# Patient Record
Sex: Male | Born: 1985 | Hispanic: Yes | Marital: Single | Smoking: Never smoker
Health system: Southern US, Community
[De-identification: ages and names within clinical notes are randomized; demographics above are authoritative.]

## PROBLEM LIST (undated history)

## (undated) DIAGNOSIS — E785 Hyperlipidemia, unspecified: Secondary | ICD-10-CM

## (undated) HISTORY — DX: Hyperlipidemia, unspecified: E78.5

---

## 2015-07-25 ENCOUNTER — Encounter: Payer: Self-pay | Admitting: Family Medicine

## 2015-07-25 ENCOUNTER — Ambulatory Visit (INDEPENDENT_AMBULATORY_CARE_PROVIDER_SITE_OTHER): Payer: Managed Care, Other (non HMO) | Admitting: Family Medicine

## 2015-07-25 VITALS — BP 117/77 | HR 66 | Ht 68.0 in | Wt 144.0 lb

## 2015-07-25 DIAGNOSIS — G43709 Chronic migraine without aura, not intractable, without status migrainosus: Secondary | ICD-10-CM | POA: Diagnosis not present

## 2015-07-25 DIAGNOSIS — M542 Cervicalgia: Secondary | ICD-10-CM | POA: Insufficient documentation

## 2015-07-25 DIAGNOSIS — G43909 Migraine, unspecified, not intractable, without status migrainosus: Secondary | ICD-10-CM | POA: Insufficient documentation

## 2015-07-25 MED ORDER — CYCLOBENZAPRINE HCL 10 MG PO TABS: 10.0000 mg | ORAL_TABLET | Freq: Three times a day (TID) | ORAL | Status: DC | PRN

## 2015-07-25 MED ORDER — SUMATRIPTAN SUCCINATE 50 MG PO TABS: 50.0000 mg | ORAL_TABLET | ORAL | Status: DC | PRN

## 2015-07-25 MED ORDER — NAPROXEN 500 MG PO TABS: 500.0000 mg | ORAL_TABLET | Freq: Two times a day (BID) | ORAL | Status: DC

## 2015-07-25 NOTE — Assessment & Plan Note (Signed)
Infrequent. Not quite yet a candidate for migraine prophylaxis. Will use Imitrex intermittently as needed.

## 2015-07-25 NOTE — Patient Instructions (Signed)
Thank you for coming in today. Come back or go to the emergency room if you notice new weakness new numbness problems walking or bowel or bladder problems. Use a heating pad.  Return if not better in 2 weeks.  Use the imitrex at the start of a migraine.   Cervical Sprain A cervical sprain is an injury in the neck in which the strong, fibrous tissues (ligaments) that connect your neck bones stretch or tear. Cervical sprains can range from mild to severe. Severe cervical sprains can cause the neck vertebrae to be unstable. This can lead to damage of the spinal cord and can result in serious nervous system problems. The amount of time it takes for a cervical sprain to get better depends on the cause and extent of the injury. Most cervical sprains heal in 1 to 3 weeks. CAUSES  Severe cervical sprains may be caused by:   Contact sport injuries (such as from football, rugby, wrestling, hockey, auto racing, gymnastics, diving, martial arts, or boxing).   Motor vehicle collisions.   Whiplash injuries. This is an injury from a sudden forward and backward whipping movement of the head and neck.  Falls.  Mild cervical sprains may be caused by:   Being in an awkward position, such as while cradling a telephone between your ear and shoulder.   Sitting in a chair that does not offer proper support.   Working at a poorly Marketing executive station.   Looking up or down for long periods of time.  SYMPTOMS   Pain, soreness, stiffness, or a burning sensation in the front, back, or sides of the neck. This discomfort may develop immediately after the injury or slowly, 24 hours or more after the injury.   Pain or tenderness directly in the middle of the back of the neck.   Shoulder or upper back pain.   Limited ability to move the neck.   Headache.   Dizziness.   Weakness, numbness, or tingling in the hands or arms.   Muscle spasms.   Difficulty swallowing or chewing.    Tenderness and swelling of the neck.  DIAGNOSIS  Most of the time your health care provider can diagnose a cervical sprain by taking your history and doing a physical exam. Your health care provider will ask about previous neck injuries and any known neck problems, such as arthritis in the neck. X-rays may be taken to find out if there are any other problems, such as with the bones of the neck. Other tests, such as a CT scan or MRI, may also be needed.  TREATMENT  Treatment depends on the severity of the cervical sprain. Mild sprains can be treated with rest, keeping the neck in place (immobilization), and pain medicines. Severe cervical sprains are immediately immobilized. Further treatment is done to help with pain, muscle spasms, and other symptoms and may include:  Medicines, such as pain relievers, numbing medicines, or muscle relaxants.   Physical therapy. This may involve stretching exercises, strengthening exercises, and posture training. Exercises and improved posture can help stabilize the neck, strengthen muscles, and help stop symptoms from returning.  HOME CARE INSTRUCTIONS   Put ice on the injured area.   Put ice in a plastic bag.   Place a towel between your skin and the bag.   Leave the ice on for 15-20 minutes, 3-4 times a day.   If your injury was severe, you may have been given a cervical collar to wear. A cervical collar is  a two-piece collar designed to keep your neck from moving while it heals.  Do not remove the collar unless instructed by your health care provider.  If you have long hair, keep it outside of the collar.  Ask your health care provider before making any adjustments to your collar. Minor adjustments may be required over time to improve comfort and reduce pressure on your chin or on the back of your head.  Ifyou are allowed to remove the collar for cleaning or bathing, follow your health care provider's instructions on how to do so  safely.  Keep your collar clean by wiping it with mild soap and water and drying it completely. If the collar you have been given includes removable pads, remove them every 1-2 days and hand wash them with soap and water. Allow them to air dry. They should be completely dry before you wear them in the collar.  If you are allowed to remove the collar for cleaning and bathing, wash and dry the skin of your neck. Check your skin for irritation or sores. If you see any, tell your health care provider.  Do not drive while wearing the collar.   Only take over-the-counter or prescription medicines for pain, discomfort, or fever as directed by your health care provider.   Keep all follow-up appointments as directed by your health care provider.   Keep all physical therapy appointments as directed by your health care provider.   Make any needed adjustments to your workstation to promote good posture.   Avoid positions and activities that make your symptoms worse.   Warm up and stretch before being active to help prevent problems.  SEEK MEDICAL CARE IF:   Your pain is not controlled with medicine.   You are unable to decrease your pain medicine over time as planned.   Your activity level is not improving as expected.  SEEK IMMEDIATE MEDICAL CARE IF:   You develop any bleeding.  You develop stomach upset.  You have signs of an allergic reaction to your medicine.   Your symptoms get worse.   You develop new, unexplained symptoms.   You have numbness, tingling, weakness, or paralysis in any part of your body.  MAKE SURE YOU:   Understand these instructions.  Will watch your condition.  Will get help right away if you are not doing well or get worse. Document Released: 10/05/2007 Document Revised: 12/13/2013 Document Reviewed: 06/15/2013 [facility] Patient Information 2015 [place], [place]. This information is not intended to replace advice given to you by your health care  provider. Make sure you discuss any questions you have with your health care provider.

## 2015-07-25 NOTE — Progress Notes (Signed)
Casey Bowers is a 29 y.o. male who presents to [facility]  today for establish care and discuss headache and neck pain.  1) migraines: Patient has a history of migraine headaches. He typically has a migraine once every 1-2 months. He had one recently that was quite bad. He no longer has a migraine. He denies any current vomiting fevers chills or diarrhea.  2) neck pain: Patient has pain at the left side of his neck at the insertion onto the skull. This is been present for a few weeks. This happened after migraine headache. He denies any radiating pain weakness or numbness. He's tried some over-the-counter medicines which helps some.   Past Medical History  Diagnosis Date  . Hyperlipidemia    History reviewed. No pertinent past surgical history. History  Substance Use Topics  . Smoking status: Never Smoker   . Smokeless tobacco: Not on file  . Alcohol Use: 0.0 oz/week    0 Standard drinks or equivalent per week   ROS as above: No weight loss sweating leg swelling chest pains palpitations shortness of breath. Medications: Current Outpatient Prescriptions  Medication Sig Dispense Refill  . cyclobenzaprine (FLEXERIL) 10 MG tablet Take 1 tablet (10 mg total) by mouth 3 (three) times daily as needed for muscle spasms. 30 tablet 1  . naproxen (NAPROSYN) 500 MG tablet Take 1 tablet (500 mg total) by mouth 2 (two) times daily with a meal. 30 tablet 1  . SUMAtriptan (IMITREX) 50 MG tablet Take 1 tablet (50 mg total) by mouth every 2 (two) hours as needed for migraine. May repeat in 2 hours if headache persists or recurs. 10 tablet 0   No current facility-administered medications for this visit.   No Known Allergies   Exam:  BP 117/77 mmHg  Pulse 66  Ht  (1.727 m)  Wt 144 lb (65.318 kg)  BMI 21.90 kg/m2 Gen: Well NAD HEENT: EOMI,  MMM Lungs: Normal work of breathing. CTABL Heart: RRR no MRG Abd: NABS, Soft. Nondistended,  Nontender Exts: Brisk capillary refill, warm and well perfused.  C-spine: Nontender to midline. Tender palpation left cervical paraspinal near the insertion onto the school. Normal neck range of motion negative Spurling's test. Upper extremity strength is equal and normal throughout. Reflexes are intact and equal bilateral upper extremities. Grip and pulses and capillary refill are intact bilateral upper extremities.  No results found for this or any previous visit (from the past 24 hour(s)). No results found.   Please see individual assessment and plan sections.

## 2015-07-25 NOTE — Assessment & Plan Note (Signed)
Cervical spasms of the left cervical paraspinal. Treat with naproxen Flexeril heating pad home exercise program. Additionally refer to physical therapy. Return if not better.

## 2015-08-02 ENCOUNTER — Ambulatory Visit (INDEPENDENT_AMBULATORY_CARE_PROVIDER_SITE_OTHER): Payer: Managed Care, Other (non HMO) | Admitting: Physical Therapy

## 2015-08-02 ENCOUNTER — Encounter: Payer: Self-pay | Admitting: Physical Therapy

## 2015-08-02 DIAGNOSIS — M542 Cervicalgia: Secondary | ICD-10-CM | POA: Diagnosis not present

## 2015-08-02 DIAGNOSIS — M436 Torticollis: Secondary | ICD-10-CM

## 2015-08-02 NOTE — Therapy (Signed)
[facility] [place] [address] [place], [place], [address] Phone: [phone]   Fax:  [phone]  Physical Therapy Evaluation  Patient Details  Name: Casey Bowers MRN: [id number] Date of Birth: [birth year] Referring Provider:  Rodolph Bong, MD  Encounter Date: 08/02/2015      PT End of Session - 08/02/15 1506    Visit Number 1   Number of Visits 7   Date for PT Re-Evaluation 08/30/15   PT Start Time 1407   PT Stop Time 1511   PT Time Calculation (min) 64 min   Activity Tolerance Patient tolerated treatment well      Past Medical History  Diagnosis Date  . Hyperlipidemia     History reviewed. No pertinent past surgical history.  There were no vitals filed for this visit.  Visit Diagnosis:  Stiffness of neck - Plan: PT plan of care cert/re-cert  Pain, neck - Plan: PT plan of care cert/re-cert      Subjective Assessment - 08/02/15 1412    Subjective Pt reports he developed neck pain and HA about 4 wks ago when he had a really bad migraine.  About 2-3 days later he had limited cervical motion and sharp pinch in his neck only on the left side. The medicine has been helping his pain, flexoril at night   Pertinent History h/o migraines. He tried Annice Pih before going to the MD   How long can you sit comfortably? intially he wasn't able to move his neck when sitting, it is better now.    Diagnostic tests none   Patient Stated Goals find out what to do so the pain doesn't come back.    Currently in Pain? No/denies  hasn't had pain since his last MD appt. however he has a feeling in the base of his head Lt side that may return            [facility] PT Assessment - 08/02/15 0001    Assessment   Medical Diagnosis neck pain    Onset Date/Surgical Date 07/02/15   Hand Dominance Right   Next MD Visit not scheduled   Prior Therapy none   Precautions   Precautions None   Balance Screen   Has the patient fallen in  the past 6 months No   Has the patient had a decrease in activity level because of a fear of falling?  No   Is the patient reluctant to leave their home because of a fear of falling?  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    AROM Assessment Site Cervical   Cervical Flexion WNL   Cervical Extension WNL   Cervical - Right Side Bend 36   Cervical - Left Side Bend 32  tightness   Cervical - Right Rotation 73   Cervical - Left Rotation 56  tightness   Strength   Overall Strength  Comments shoulders WNL and equal bilat    Flexibility   Soft Tissue Assessment /Muscle Length --  tight pecs bilat   Palpation   Spinal mobility hypomobile through thoracic spine, C3 rotated Lt, pain with UPA mobs here on the Lt side.    Palpation comment palpable trigger point in Lt cerivcal paraspinal C2-4                   [facility] Adult PT Treatment/Exercise - 08/02/15 0001    Exercises   Exercises Neck   Neck Exercises: Theraband   Shoulder External Rotation --  3x10 with yellow band, bilat, elbows by side   Neck Exercises: Seated   Neck Retraction 10 reps   Other Seated Exercise scapular retraction x 10   Other Seated Exercise doorway stretch    Modalities   Modalities Electrical Stimulation;Moist Heat   Moist Heat Therapy   Number Minutes Moist Heat 15 Minutes   Moist Heat Location  Cervical   Electrical Stimulation   Electrical Stimulation Location cervical    Electrical Stimulation Action IFC   Electrical Stimulation Parameters to tolerance   Electrical Stimulation Goals Pain                PT Education - 08/02/15 1504    Education provided Yes   Education Details HEP   Person(s) Educated Patient   Methods Explanation;Demonstration;Handout  handout in spanish   Comprehension Returned demonstration             PT Long Term Goals - 08/02/15 1509    PT LONG TERM GOAL #1   Title I with advanced HEP ( 08/30/15)    Time 4   Period Weeks   Status New   PT LONG TERM GOAL #2   Title increase cervical rotation Lt =/> 60 degrees ( 08/30/15)    Time 4   Period Weeks   Status New   PT LONG TERM GOAL #3   Title report =/> 75% reduction in feelings of stiffness in his neck ( 08/30/15)    Time 4   Period Weeks   Status New   PT LONG TERM GOAL #4   Title improve FOTO =/< 13% impaired ( 08/30/15)    Time 4   Period Weeks   Status New               Plan - 08/02/15 1506    Clinical Impression Statement 29 yo male presents with h/o neck pain and stiffness. It is significantly improved since starting to use medication.  He presents with limited cervical rotation, trigger point and C3 rotation. He also has significant postural changes that place stress on his neck and upper back.    Pt will benefit from skilled therapeutic intervention in order to improve on the following deficits Postural dysfunction;Hypomobility;Pain;Increased muscle spasms;Decreased range of motion   Rehab Potential Excellent   PT Frequency 2x / week   PT Duration 4 weeks   PT Treatment/Interventions Cryotherapy;Electrical Stimulation;Patient/family education;Passive range of motion;Neuromuscular re-education;Manual techniques;Therapeutic exercise;Traction;Moist Heat   PT Next Visit Plan manual therapy TPR to cervical paraspinals, C - spine mobs if still rotated at C3, cervical stab  and postural exercise.    Consulted and Agree with Plan of Care Patient         Problem List Patient Active Problem List   Diagnosis Date Noted  . Migraine headache 07/25/2015  . Neck pain 07/25/2015    Roderic Scarce PT 08/02/2015, 3:20 PM  [facility] [place] [address] [place], [place], [address] Phone: [phone]   Fax:  [phone]

## 2015-08-02 NOTE — Patient Instructions (Signed)
Axial Extension (Chin Tuck)     Hunda el mentn y alargue la parte posterior del cuello. Mantenga __3-5__ segundos mientras cuenta en voz alta. Repita _10___ veces. Haga __4-5__ sesiones por da.  [email or web address].[email or web address]   Scapular Retraction (Standing)   Con brazos a los lados del cuerpo, [employer]. Repita __10__ veces por rutina. Realice _1___ Carlis Stable por sesin. Realice _4-5___ sesiones por da.    Resisted External Rotation: in Neutral - Bilateral   Sentado o de pie, agarre la banda elstica con ambas manos, codos a los lados doblados a 90, antebrazos hacia adelante. Una los omplatos y rote los antebrazos hacia fuera. Mantenga los codos pegados al cuerpo. Repita _10___ veces por rutina. Realice __3__ Carlis Stable por sesin. Realice __1__ sesiones por da.  Pectoral Stretch   Con las manos detrs del marco de la puerta, inclnese suavemente hacia adelante. Debe sentir un estiramiento en el pecho. Mantenga 20-30____ segundos. Repita _1-2___ veces. Haga __1__ sesiones por da.    Karis Juba [phone]

## 2015-08-06 ENCOUNTER — Ambulatory Visit (INDEPENDENT_AMBULATORY_CARE_PROVIDER_SITE_OTHER): Payer: Managed Care, Other (non HMO) | Admitting: Physical Therapy

## 2015-08-06 DIAGNOSIS — M436 Torticollis: Secondary | ICD-10-CM | POA: Diagnosis not present

## 2015-08-06 DIAGNOSIS — M542 Cervicalgia: Secondary | ICD-10-CM

## 2015-08-06 NOTE — Therapy (Signed)
[facility] [place] [address] [place], [place], [address] Phone: [phone]   Fax:  [phone]  Physical Therapy Treatment  Patient Details  Name: Casey Bowers MRN: [id number] Date of Birth: Jul 04, 1986 Referring Provider:  Rodolph Bong, MD  Encounter Date: 08/06/2015      PT End of Session - 08/06/15 1409    Visit Number 2   Number of Visits 7   Date for PT Re-Evaluation 08/30/15   PT Start Time 1405   PT Stop Time 1509   PT Time Calculation (min) 64 min   Activity Tolerance Patient tolerated treatment well      Past Medical History  Diagnosis Date  . Hyperlipidemia     No past surgical history on file.  There were no vitals filed for this visit.  Visit Diagnosis:  Stiffness of neck  Pain, neck      Subjective Assessment - 08/06/15 1409    Subjective Pt reports he hasn't had any headaches since last visit. Neck and shoulders feel sore and tight from performing HEP; 5x/day.    Currently in Pain? No/denies            [facility] PT Assessment - 08/06/15 0001    Assessment   Medical Diagnosis neck pain    Onset Date/Surgical Date 07/02/15   Hand Dominance Right   Next MD Visit not scheduled   Prior Therapy none   AROM   AROM Assessment Site Cervical   Cervical - Right Side Bend 36   Cervical - Left Side Bend 35   Cervical - Right Rotation 75   Cervical - Left Rotation 75                     [facility] Adult PT Treatment/Exercise - 08/06/15 0001    Neck Exercises: Machines for Strengthening   UBE (Upper Arm Bike) L2: 2 min forward/2 min backward   Neck Exercises: Standing   Other Standing Exercises scapula retraction 5 sec hold x 5 reps, tactile cues for proper alignment of head   Neck Exercises: Seated   Neck Retraction 5 reps;10 secs  tactile cues for form   Other Seated Exercise bilateral shoulder ER with red band x 10 x 2 sets   Neck Exercises: Supine   Neck Retraction 10  reps   Upper Extremity D2 Flexion;10 reps  Sash, each arm    Theraband Level (UE D2) Level 2 (Red)   Other Supine Exercise Horizontal abduction with red band (bilat) x 10 x 2 sets    Manual Therapy   Manual Therapy Joint mobilization;Soft tissue mobilization;Passive ROM  performed by supervising PT, Celyn Holt    Manual therapy comments Pt continues with tightness at Lt C3 transverse process, scalenes    Joint Mobilization lateral C3 grade III   Soft tissue mobilization over pressure into axial extension (tender/ increased pain); manual traction; occipital inhibition    Passive ROM end range cervical flexion with slight rotation to the left x 2 reps    Neck Exercises: Stretches   Upper Trapezius Stretch 2 reps;30 seconds  with towel anchoring shoulder   Levator Stretch 1 rep;20 seconds   Other Neck Stretches Doorway stretch mid level x 30 sec x 2 reps, low level x 30 sec x 2 reps              PT Education - 08/06/15 1502    Education provided Yes   Education Details HEP   Person(s)  Educated Patient   Methods Explanation;Demonstration;Handout   Comprehension Verbalized understanding;Returned demonstration             PT Long Term Goals - 08/02/15 1509    PT LONG TERM GOAL #1   Title I with advanced HEP ( 08/30/15)    Time 4   Period Weeks   Status New   PT LONG TERM GOAL #2   Title increase cervical rotation Lt =/> 60 degrees ( 08/30/15)    Time 4   Period Weeks   Status New   PT LONG TERM GOAL #3   Title report =/> 75% reduction in feelings of stiffness in his neck ( 08/30/15)    Time 4   Period Weeks   Status New   PT LONG TERM GOAL #4   Title improve FOTO =/< 13% impaired ( 08/30/15)    Time 4   Period Weeks   Status New               Plan - 08/06/15 1436    Clinical Impression Statement Pt tolerated new exercises well without increase in pain.  Pt required frequent tactile cues to correct form and decrease strain on neck. Pt demo increased neck ROM  this visit. Pt did have slight increase in pain with manual therapy; eliminated with use of MHP and estim at end of session.  Pt making progress towards goals.    Pt will benefit from skilled therapeutic intervention in order to improve on the following deficits Postural dysfunction;Hypomobility;Pain;Increased muscle spasms;Decreased range of motion   Rehab Potential Excellent   PT Frequency 2x / week   PT Duration 4 weeks   PT Treatment/Interventions Cryotherapy;Electrical Stimulation;Patient/family education;Passive range of motion;Neuromuscular re-education;Manual techniques;Therapeutic exercise;Traction;Moist Heat   PT Next Visit Plan manual therapy TPR to cervical paraspinals, C - spine mobs if still rotated at C3, cervical stab and postural exercise.    Consulted and Agree with Plan of Care Patient        Problem List Patient Active Problem List   Diagnosis Date Noted  . Migraine headache 07/25/2015  . Neck pain 07/25/2015    Mayer Camel, PTA 08/06/2015 3:21 PM  [facility] [place] [address] [place], [place], [address] Phone: [phone]   Fax:  [phone]   Manual therapy and cervical mobs in supine as noted above by PT Celyn P. Leonor Liv, PT, MPH 08/06/15 5:01pm

## 2015-08-06 NOTE — Patient Instructions (Signed)
Side Pull: Double Arm   On back, knees bent, feet flat. Arms perpendicular to body, shoulder level, elbows straight but relaxed. Pull arms out to sides, elbows straight. Resistance band comes across collarbones, hands toward floor. Hold momentarily. Slowly return to starting position. Repeat _10__ times, 2 sets. Band color _red____   [name]   On back, knees bent, feet flat, left hand on left hip, right hand above left. Pull right arm DIAGONALLY (hip to shoulder) across chest. Bring right arm along head toward floor. Hold momentarily. Slowly return to starting position. Repeat _10__ times. Do with left arm. Band color _red_____   Shoulder Rotation: Double Arm   On back, knees bent, feet flat, elbows tucked at sides, bent 90, hands palms up. Pull hands apart and down toward floor, keeping elbows near sides. Hold momentarily. Slowly return to starting position. Repeat _10__ times, 2 sets. Band color __red____   Side Bend, Sitting   Sit, head in comfortable, centered position, chin slightly tucked. Gently tilt head, bringing ear toward same-side shoulder. Can put towel over shoulder. Hold _30__ seconds.  Repeat _2__ times per session. Do _2__ sessions per day.   [facility] [address] [place], [place] [address]  [phone] (office) [phone] (fax)

## 2015-08-09 ENCOUNTER — Ambulatory Visit (INDEPENDENT_AMBULATORY_CARE_PROVIDER_SITE_OTHER): Payer: Managed Care, Other (non HMO) | Admitting: Rehabilitative and Restorative Service Providers"

## 2015-08-09 ENCOUNTER — Encounter: Payer: Self-pay | Admitting: Rehabilitative and Restorative Service Providers"

## 2015-08-09 DIAGNOSIS — M436 Torticollis: Secondary | ICD-10-CM

## 2015-08-09 DIAGNOSIS — M542 Cervicalgia: Secondary | ICD-10-CM | POA: Diagnosis not present

## 2015-08-09 NOTE — Patient Instructions (Signed)
Scapula Adduction With Pectorals, Low   Stand in doorframe with palms against frame and arms at 45. Lean forward and squeeze shoulder blades. Hold __30_ seconds. Repeat __3_ times per session. Do _2-3__ sessions per day.  Copyright  VHI. All rights reserved.    Scapula Adduction With Pectorals, Mid-Range   Stand in doorframe with palms against frame and arms at 90. Lean forward and squeeze shoulder blades. Hold _30_ seconds. Repeat __3_ times per session. Do _2-3__ sessions per day.   \Scapula Adduction With Pectorals, High   Stand in doorframe with palms against frame and arms at 120. Lean forward and squeeze shoulder blades. Hold _30__ seconds. Repeat _3__ times per session. Do _2-3__ sessions per day.   Resisted External Rotation: in Neutral - Bilateral   PALMS UP Sit or stand, tubing in both hands, elbows at sides, bent to 90, forearms forward. Pinch shoulder blades together and rotate forearms out. Keep elbows at sides. Repeat __10__ times per set. Do _2-3___ sets per session. Do _2-3___ sessions per day.   Low Row: Standing   Face anchor, feet shoulder width apart. Palms up, pull arms back, squeezing shoulder blades together. Repeat 10__ times per set. Do 2-3__ sets per session. Do 2-3__ sessions per week. Anchor Height: Waist     Strengthening: Resisted Extension   Hold tubing in right hand, arm forward. Pull arm back, elbow straight. Repeat _10___ times per set. Do 2-3____ sets per session. Do 2-3____ sessions per day.

## 2015-08-09 NOTE — Therapy (Signed)
[facility] [address] [place], [place], [address] Phone: [phone]   Fax:  [phone]  Physical Therapy Treatment  Patient Details  Name: Casey Bowers MRN: [id number] Date of Birth: 1986-10-04 Referring Provider:  Rodolph Bong, MD  Encounter Date: 08/09/2015      PT End of Session - 08/09/15 1418    Visit Number 3   Number of Visits 7   Date for PT Re-Evaluation 08/30/15   PT Start Time 0206   PT Stop Time 0254   PT Time Calculation (min) 48 min   Activity Tolerance Patient tolerated treatment well      Past Medical History  Diagnosis Date  . Hyperlipidemia     History reviewed. No pertinent past surgical history.  There were no vitals filed for this visit.  Visit Diagnosis:  Stiffness of neck  Pain, neck      Subjective Assessment - 08/09/15 1403    Subjective No headaches; soreness from last treatment and exercises but feels the exercises are going well and he is getting better, especially since last treatment   Currently in Pain? No/denies                         [facility] Adult PT Treatment/Exercise - 08/09/15 0001    Neck Exercises: Machines for Strengthening   UBE (Upper Arm Bike) L2: 2 min forward/2 min backward   Neck Exercises: Theraband   Scapula Retraction 10 reps  yellow - w/ scap squeeze w/ noodle   Shoulder Extension 10 reps;Red  w/ noodle   Rows 10 reps;Red  w/ noodle   Neck Exercises: Standing   Other Standing Exercises scap squeeze with noodle 10 sec hold 10 reps   Neck Exercises: Seated   Neck Retraction 10 reps;10 secs  tactile cues for form   Neck Exercises: Supine   Neck Retraction 10 reps   Modalities   Modalities Electrical Stimulation;Moist Heat   Cryotherapy   Number Minutes Cryotherapy 15 Minutes   Cryotherapy Location Cervical   Type of Cryotherapy Ice pack   Electrical Stimulation   Electrical Stimulation Location cervical    Electrical Stimulation Action IFC   Electrical Stimulation Parameters to tolerance   Electrical Stimulation Goals Pain   Manual Therapy   Manual Therapy Joint mobilization;Soft tissue mobilization;Passive ROM  performed by supervising PT, Cheikh Bramble    Manual therapy comments improved tightness at Lt C3 transverse process, scalenes    Joint Mobilization lateral C3 grade III   Soft tissue mobilization over pressure into axial extension; manual traction; occipital inhibition soft tissue work through Lt lateral cervical musculature paraspinals and scaleni   Passive ROM end range cervical flexion in straight plane and with slight rotation to the left x 2 reps in each direction                PT Education - 08/09/15 1417    Education provided Yes   Education Details posture and alignment; stretching; strengthening; HEP   Person(s) Educated Patient   Methods Explanation;Demonstration;Tactile cues;Verbal cues;Handout   Comprehension Verbalized understanding;Returned demonstration;Verbal cues required;Tactile cues required             PT Long Term Goals - 08/09/15 1457    PT LONG TERM GOAL #1   Title I with advanced HEP ( 08/30/15)    Time 4   Period Weeks   Status On-going   PT LONG TERM GOAL #2   Title  increase cervical rotation Lt =/> 60 degrees ( 08/30/15)    Time 4   Period Weeks   Status On-going   PT LONG TERM GOAL #3   Title report =/> 75% reduction in feelings of stiffness in his neck ( 08/30/15)    Time 4   Period Weeks   Status On-going   PT LONG TERM GOAL #4   Title improve FOTO =/< 13% impaired ( 08/30/15)    Time 4   Period Weeks   Status On-going               Plan - 08/09/15 1455    Clinical Impression Statement Good response to manual cervical work; decreased tenderness and tightness to palpation through cervical musculature including C3 area.     Pt will benefit from skilled therapeutic intervention in order to improve on the following deficits  Postural dysfunction;Hypomobility;Pain;Increased muscle spasms;Decreased range of motion   Rehab Potential Excellent   PT Frequency 2x / week   PT Duration 4 weeks   PT Treatment/Interventions Cryotherapy;Electrical Stimulation;Patient/family education;Passive range of motion;Neuromuscular re-education;Manual techniques;Therapeutic exercise;Traction;Moist Heat   PT Next Visit Plan manual therapy TPR to cervical paraspinals, C - spine mobs as indicated, cervical stab and postural exercise.    PT Home Exercise Plan 3 way doorway stretch; posterior shoulder girdle strengthening   Consulted and Agree with Plan of Care Patient        Problem List Patient Active Problem List   Diagnosis Date Noted  . Migraine headache 07/25/2015  . Neck pain 07/25/2015    Deepa Barthel Rober Minion, PT, MPH 08/09/2015, 3:07 PM  [facility] [address] [place], [place], [address] Phone: [phone]   Fax:  [phone]

## 2015-08-13 ENCOUNTER — Ambulatory Visit (INDEPENDENT_AMBULATORY_CARE_PROVIDER_SITE_OTHER): Payer: Managed Care, Other (non HMO) | Admitting: Physical Therapy

## 2015-08-13 DIAGNOSIS — M436 Torticollis: Secondary | ICD-10-CM

## 2015-08-13 NOTE — Therapy (Signed)
[place], [place], [address] Phone: [phone]   Fax:  [phone]  Physical Therapy Treatment  Patient Details  Name: Casey Bowers MRN: [id number] Date of Birth: 10-12-1986 Referring Provider:  Gregor Hams, MD  Encounter Date: 08/13/2015      PT End of Session - 08/13/15 1432    Visit Number 4   Number of Visits 7   Date for PT Re-Evaluation 08/30/15   PT Start Time 1401   PT Stop Time 5053   PT Time Calculation (min) 41 min   Activity Tolerance No increased pain;Patient tolerated treatment well      Past Medical History  Diagnosis Date  . Hyperlipidemia     No past surgical history on file.  There were no vitals filed for this visit.  Visit Diagnosis:  No diagnosis found.      Subjective Assessment - 08/13/15 1405    Subjective Pt reports he thinks he may have overdid over weekend, was sore in Rt upper traps.  No headaches. Pt reports he feels like he is getting better with each visit.    Currently in Pain? No/denies            [facility] PT Assessment - 08/13/15 0001    Assessment   Medical Diagnosis neck pain    Onset Date/Surgical Date 07/02/15   Hand Dominance Right   Next MD Visit not scheduled   Prior Therapy none           [facility] Adult PT Treatment/Exercise - 08/13/15 0001    Neck Exercises: Machines for Strengthening   UBE (Upper Arm Bike) L3: 2 min forward/2 min backward   Neck Exercises: Theraband   Rows 10 reps;Red  2 sets with noodle behind back    Shoulder External Rotation 10 reps;Red  with noodle behind back, 2 sets   Neck Exercises: Standing   Neck Retraction 10 reps;3 secs   Neck Exercises: Prone   Axial Exentsion 10 reps   W Back 5 reps  2 sets, with axial extension   Other Prone Exercise Cat/cow stretch x 25 sec x 4 reps.    Cryotherapy   Number Minutes Cryotherapy 12 Minutes   Cryotherapy Location Cervical   Type of Cryotherapy  Ice pack   Neck Exercises: Stretches   Upper Trapezius Stretch 2 reps;30 seconds  with towel anchoring shoulder   Other Neck Stretches Doorway stretch mid level x 30 sec x 2 reps, low level x 30 sec x 2 reps             PT Long Term Goals - 08/13/15 1417    PT LONG TERM GOAL #1   Title I with advanced HEP ( 08/30/15)    Time 4   Period Weeks   Status On-going   PT LONG TERM GOAL #2   Title increase cervical rotation Lt =/> 60 degrees ( 08/30/15)    Time 4   Period Weeks   Status Achieved   PT LONG TERM GOAL #3   Title report =/> 75% reduction in feelings of stiffness in his neck ( 08/30/15)    Status On-going  pt reports 40% reduction reported on 08/13/15   PT LONG TERM GOAL #4   Title improve FOTO =/< 13% impaired ( 08/30/15)    Time 4   Period Weeks   Status On-going               Plan - 08/13/15  1424    Clinical Impression Statement Pt notes 40% reduction of stiffness in neck since beginning therapy; progressing towards LTG #3.  Last headache was a month ago.  Pt tolerated new exercises without production of symptoms.  Has met LTG #2.    Pt will benefit from skilled therapeutic intervention in order to improve on the following deficits Postural dysfunction;Hypomobility;Pain;Increased muscle spasms;Decreased range of motion   Rehab Potential Excellent   PT Treatment/Interventions Cryotherapy;Electrical Stimulation;Patient/family education;Passive range of motion;Neuromuscular re-education;Manual techniques;Therapeutic exercise;Traction;Moist Heat   PT Next Visit Plan Assess response to new exercises; continue progressive strengthening/stretching to cervical spine/posture, manual and modalities as needed.    Consulted and Agree with Plan of Care Patient        Problem List Patient Active Problem List   Diagnosis Date Noted  . Migraine headache 07/25/2015  . Neck pain 07/25/2015   Kerin Perna, PTA 08/13/2015 2:35 PM  [place], [place], [address] Phone: [phone]   Fax:  [phone]

## 2015-08-16 ENCOUNTER — Ambulatory Visit (INDEPENDENT_AMBULATORY_CARE_PROVIDER_SITE_OTHER): Payer: Managed Care, Other (non HMO) | Admitting: Physical Therapy

## 2015-08-16 DIAGNOSIS — M542 Cervicalgia: Secondary | ICD-10-CM | POA: Diagnosis not present

## 2015-08-16 DIAGNOSIS — M436 Torticollis: Secondary | ICD-10-CM | POA: Diagnosis not present

## 2015-08-16 NOTE — Therapy (Addendum)
[place], [place], [address] Phone: [phone]   Fax:  [phone]  Physical Therapy Treatment  Patient Details  Name: Casey Bowers MRN: [id number] Date of Birth: 04-08-1986 Referring Provider:  Gregor Hams, MD  Encounter Date: 08/16/2015      PT End of Session - 08/16/15 1405    Visit Number 5   Number of Visits 7   Date for PT Re-Evaluation 08/30/15   PT Start Time 1540   PT Stop Time 0867   PT Time Calculation (min) 43 min   Activity Tolerance Patient tolerated treatment well;No increased pain      Past Medical History  Diagnosis Date  . Hyperlipidemia     No past surgical history on file.  There were no vitals filed for this visit.  Visit Diagnosis:  Stiffness of neck  Pain, neck      Subjective Assessment - 08/16/15 1405    Subjective Pt reports that he had a headache 2 days ago, in posterior part of head. Pt felt it was stress/work related.  Resolved with medicine and rest.    Currently in Pain? No/denies            [facility] PT Assessment - 08/16/15 0001    Assessment   Medical Diagnosis neck pain    Onset Date/Surgical Date 07/02/15   Hand Dominance Right   Next MD Visit not scheduled   Prior Therapy none          [facility] Adult PT Treatment/Exercise - 08/16/15 0001    Neck Exercises: Machines for Strengthening   UBE (Upper Arm Bike) L3: 2 min forward/2 min backward   Neck Exercises: Theraband   Shoulder External Rotation 10 reps;Green  with noodle behind back, 2 sets   Neck Exercises: Standing   Neck Retraction 10 reps   Other Standing Exercises Sash D2 flex with red band x 10 reps x 2 sets each side.    Other Standing Exercises Rows BUE with green band x 10 x 2 sets; shoulder extension with green band and focus on scap squeeze x 10 reps    Modalities   Modalities --  declined. Will perform at home if needed.    Neck Exercises: Stretches   Upper  Trapezius Stretch 2 reps;30 seconds  with towel anchoring shoulder   Other Neck Stretches Doorway stretch mid level x 30 sec x 2 reps, low level x 30 sec x 2 reps    Other Neck Stretches cross chest stretch x 30 sec x 2 reps each arm            PT Education - 08/16/15 1444    Education provided Yes   Education Details Pt issued green band to advance current HEP.    Person(s) Educated Patient   Methods Explanation   Comprehension Verbalized understanding;Returned demonstration             PT Long Term Goals - 08/16/15 1407    PT LONG TERM GOAL #1   Title I with advanced HEP ( 08/30/15)    Time 4   Period Weeks   Status Achieved   PT LONG TERM GOAL #2   Title increase cervical rotation Lt =/> 60 degrees ( 08/30/15)    Time 4   Period Weeks   Status Achieved   PT LONG TERM GOAL #3   Title report =/> 75% reduction in feelings of stiffness in his neck ( 08/30/15)  Time 4   Period Weeks   Status Achieved  Pt reports 70-80% reduction in stiffness in neck   PT LONG TERM GOAL #4   Title improve FOTO =/< 13% impaired ( 08/30/15)    Time 4   Period Weeks   Status Not Met  scored 18% limitation            Plan - 08/16/15 1454    Clinical Impression Statement Pt tolerated increased resistance with exercises without production of pain.  Pt notes 70-80% reduction of stiffness, meeting LTG#3.  Pt has partially met his goals but is satisfied with his current level of function.  Pt requests to d/c.    Pt will benefit from skilled therapeutic intervention in order to improve on the following deficits Postural dysfunction;Hypomobility;Pain;Increased muscle spasms;Decreased range of motion   Rehab Potential Excellent   PT Frequency 2x / week   PT Duration 4 weeks   PT Treatment/Interventions Cryotherapy;Electrical Stimulation;Patient/family education;Passive range of motion;Neuromuscular re-education;Manual techniques;Therapeutic exercise;Traction;Moist Heat   PT Next Visit Plan  Spoke to supervising PT; will d/c to HEP.    Consulted and Agree with Plan of Care Patient        Problem List Patient Active Problem List   Diagnosis Date Noted  . Migraine headache 07/25/2015  . Neck pain 07/25/2015    Kerin Perna, PTA 08/16/2015 2:55 PM  [place], [place], [address] Phone: [phone]   Fax:  [phone]     PHYSICAL THERAPY DISCHARGE SUMMARY  Visits from Start of Care: 5  Current functional level related to goals / functional outcomes: See above   Remaining deficits: FOTO   Education / Equipment: HEP Plan: Patient agrees to discharge.  Patient goals were partially met. Patient is being discharged due to being pleased with the current functional level.  ?????       Jeral Pinch, PT 08/16/2015 3:15 PM

## 2015-08-20 ENCOUNTER — Encounter: Payer: Managed Care, Other (non HMO) | Admitting: Physical Therapy

## 2016-02-12 ENCOUNTER — Encounter: Payer: Self-pay | Admitting: Family Medicine

## 2016-02-12 ENCOUNTER — Ambulatory Visit (INDEPENDENT_AMBULATORY_CARE_PROVIDER_SITE_OTHER): Payer: Managed Care, Other (non HMO) | Admitting: Family Medicine

## 2016-02-12 VITALS — BP 120/63 | HR 63 | Wt 144.0 lb

## 2016-02-12 DIAGNOSIS — Z114 Encounter for screening for human immunodeficiency virus [HIV]: Secondary | ICD-10-CM

## 2016-02-12 DIAGNOSIS — Z23 Encounter for immunization: Secondary | ICD-10-CM

## 2016-02-12 DIAGNOSIS — IMO0001 Reserved for inherently not codable concepts without codable children: Secondary | ICD-10-CM | POA: Insufficient documentation

## 2016-02-12 DIAGNOSIS — Z0000 Encounter for general adult medical examination without abnormal findings: Secondary | ICD-10-CM | POA: Diagnosis not present

## 2016-02-12 LAB — LIPID PANEL
Cholesterol: 172 mg/dL (ref 125–200)
HDL: 32 mg/dL — ABNORMAL LOW (ref >=40)
LDL CALC: 101 mg/dL (ref <130)
Total CHOL/HDL Ratio: 5.4 Ratio — ABNORMAL HIGH (ref <=5.0)
Triglycerides: 196 mg/dL — ABNORMAL HIGH (ref <150)
VLDL: 39 mg/dL — AB (ref <30)

## 2016-02-12 LAB — CBC
HCT: 44.4 % (ref 39.0–52.0)
HEMOGLOBIN: 15.4 g/dL (ref 13.0–17.0)
MCH: 31.5 pg (ref 26.0–34.0)
MCHC: 34.7 g/dL (ref 30.0–36.0)
MCV: 90.8 fL (ref 78.0–100.0)
MPV: 10.5 fL (ref 8.6–12.4)
PLATELETS: 240 10*3/uL (ref 150–400)
RBC: 4.89 MIL/uL (ref 4.22–5.81)
RDW: 13 % (ref 11.5–15.5)
WBC: 5.6 10*3/uL (ref 4.0–10.5)

## 2016-02-12 LAB — COMPREHENSIVE METABOLIC PANEL
ALBUMIN: 4.4 g/dL (ref 3.6–5.1)
ALT: 19 U/L (ref 9–46)
AST: 17 U/L (ref 10–40)
Alkaline Phosphatase: 84 U/L (ref 40–115)
BILIRUBIN TOTAL: 0.5 mg/dL (ref 0.2–1.2)
BUN: 12 mg/dL (ref 7–25)
CHLORIDE: 103 mmol/L (ref 98–110)
CO2: 25 mmol/L (ref 20–31)
CREATININE: 0.79 mg/dL (ref 0.60–1.35)
Calcium: 9.6 mg/dL (ref 8.6–10.3)
GLUCOSE: 98 mg/dL (ref 65–99)
Potassium: 3.7 mmol/L (ref 3.5–5.3)
SODIUM: 140 mmol/L (ref 135–146)
Total Protein: 6.9 g/dL (ref 6.1–8.1)

## 2016-02-12 NOTE — Patient Instructions (Signed)
Thank you for coming in today. Get labs today.  Return in 6-12 months.  Use Imitrex as needed for migraines.

## 2016-02-12 NOTE — Progress Notes (Signed)
       Casey Bowers is a 30 y.o. male who presents to [facility]: Primary Care today for wellness visit and work biometric screening. Patient presents to clinic today for wellness visit. He has no complaints and no medical problems. He takes Imitrex intermittently for migraines. Has been multiple months since his last migraine. He does not smoke or use smokeless tobacco. He drinks only mildly and intermittently. He has had influenza vaccination at work already this season. He feels well.   Past Medical History  Diagnosis Date  . Hyperlipidemia    History reviewed. No pertinent past surgical history. Social History  Substance Use Topics  . Smoking status: Never Smoker   . Smokeless tobacco: Never Used  . Alcohol Use: 0.0 oz/week    0 Standard drinks or equivalent per week   family history is not on file.  ROS as above Medications: Current Outpatient Prescriptions  Medication Sig Dispense Refill  . SUMAtriptan (IMITREX) 50 MG tablet Take 1 tablet (50 mg total) by mouth every 2 (two) hours as needed for migraine. May repeat in 2 hours if headache persists or recurs. 10 tablet 0   No current facility-administered medications for this visit.   No Known Allergies   Exam:  BP 120/63 mmHg  Pulse 63  Wt 144 lb (65.318 kg) Gen: Well NAD HEENT: EOMI,  MMM Lungs: Normal work of breathing. CTABL Heart: RRR no MRG Abd: NABS, Soft. Nondistended, Nontender Exts: Brisk capillary refill, warm and well perfused.   No results found for this or any previous visit (from the past 24 hour(s)). No results found.   Please see individual assessment and plan sections.

## 2016-02-12 NOTE — Assessment & Plan Note (Signed)
Doing well. Obtain fasting blood work. Recheck in one year or sooner if needed. Tdap given prior to discharge.

## 2016-02-13 LAB — VITAMIN D 25 HYDROXY (VIT D DEFICIENCY, FRACTURES): VIT D 25 HYDROXY: 22 ng/mL — AB (ref 30–100)

## 2016-02-13 LAB — HIV ANTIBODY (ROUTINE TESTING W REFLEX): HIV: NONREACTIVE

## 2016-02-14 NOTE — Progress Notes (Signed)
Quick Note:  Vitamin D deficiency noted. Take 2000 units of vitamin D daily over-the-counter.   ______ 

## 2016-03-17 ENCOUNTER — Encounter: Payer: Self-pay | Admitting: *Deleted

## 2016-03-17 ENCOUNTER — Emergency Department (INDEPENDENT_AMBULATORY_CARE_PROVIDER_SITE_OTHER)
Admission: EM | Admit: 2016-03-17 | Discharge: 2016-03-17 | Disposition: A | Discharge status: Home / Self Care | Payer: Managed Care, Other (non HMO) | Source: Home / Self Care | Attending: Family Medicine | Admitting: Family Medicine

## 2016-03-17 DIAGNOSIS — R110 Nausea: Secondary | ICD-10-CM

## 2016-03-17 DIAGNOSIS — R197 Diarrhea, unspecified: Secondary | ICD-10-CM | POA: Diagnosis not present

## 2016-03-17 MED ORDER — ONDANSETRON 4 MG PO TBDP: ORAL_TABLET | ORAL | Status: DC

## 2016-03-17 MED ORDER — ONDANSETRON 4 MG PO TBDP: 4.0000 mg | ORAL_TABLET | Freq: Once | ORAL | Status: DC

## 2016-03-17 NOTE — ED Provider Notes (Signed)
CSN: [id number]     Arrival date & time 03/17/16  1624 History   First MD Initiated Contact with Patient 03/17/16 1654     Chief Complaint  Patient presents with  . Emesis      Patient is a 30 y.o. male presenting with diarrhea. The history is provided by the patient.  Diarrhea Quality:  Watery Severity:  Moderate Onset quality:  Sudden Duration:  1 day Timing:  Intermittent Progression:  Unchanged Relieved by:  Nothing Exacerbated by: eating. Ineffective treatments: Pepto Bismol. Associated symptoms: abdominal pain, chills, diaphoresis and vomiting   Associated symptoms: no arthralgias, no recent cough, no fever, no headaches, no myalgias and no URI   Vomiting:    Quality:  Stomach contents   Severity:  Mild   Duration:  1 day   Timing:  Sporadic   Progression:  Resolved Risk factors: no recent antibiotic use, no sick contacts, no suspicious food intake and no travel to endemic areas     Past Medical History  Diagnosis Date  . Hyperlipidemia    History reviewed. No pertinent past surgical history. History reviewed. No pertinent family history. Social History  Substance Use Topics  . Smoking status: Never Smoker   . Smokeless tobacco: Never Used  . Alcohol Use: 0.0 oz/week    0 Standard drinks or equivalent per week    Review of Systems  Constitutional: Positive for chills and diaphoresis. Negative for fever.  Gastrointestinal: Positive for vomiting, abdominal pain and diarrhea.  Musculoskeletal: Negative for myalgias and arthralgias.  Neurological: Negative for headaches.    Allergies  Review of patient's allergies indicates no known allergies.  Home Medications   Prior to Admission medications   Medication Sig Start Date End Date Taking? Authorizing Provider  ondansetron (ZOFRAN ODT) 4 MG disintegrating tablet Take one tab by mouth Q6hr prn nausea 03/17/16   Lattie Haw, MD  SUMAtriptan (IMITREX) 50 MG tablet Take 1 tablet (50 mg total) by mouth every 2  (two) hours as needed for migraine. May repeat in 2 hours if headache persists or recurs. 07/25/15   Rodolph Bong, MD   Meds Ordered and Administered this Visit   Medications  ondansetron (ZOFRAN-ODT) disintegrating tablet 4 mg (not administered)    BP 109/73 mmHg  Pulse 70  Temp(Src) 97.6 F (36.4 C) (Oral)  Resp 16  Wt 145 lb (65.772 kg)  SpO2 100% No data found.   Physical Exam Nursing notes and Vital Signs reviewed. Appearance:  Patient appears stated age, and in no acute distress Eyes:  Pupils are equal, round, and reactive to light and accomodation.  Extraocular movement is intact.  Conjunctivae are not inflamed  Nose:  Normal Pharynx:  Normal; moist mucous membranes  Neck:  Supple.  No adenopathy Lungs:  Clear to auscultation.  Breath sounds are equal.  Moving air well. Heart:  Regular rate and rhythm without murmurs, rubs, or gallops.  Abdomen:  Mild diffuse tenderness without masses or hepatosplenomegaly.  Bowel sounds are present and increased.  No CVA or flank tenderness.  Extremities:  No edema.  Skin:  No rash present.   ED Course  Procedures none    MDM   1. Nausea without vomiting; suspect viral gastroenteritis  2. Diarrhea, unspecified type    Zofran ODT  administered PO.  Given Rx for same Begin clear liquids (Pedialyte while having diarrhea) until improved, then advance to a [employer] (Bananas, Rice, Applesauce, Toast).  Then gradually resume a regular diet when tolerated.  Avoid milk products until well.  When stools become more formed, may take Imodium (loperamide) once or twice daily to decrease stool frequency.  If symptoms become significantly worse during the night or over the weekend, proceed to the local emergency room.     Lattie HawStephen A Beese, MD 03/17/16 1910

## 2016-03-17 NOTE — Discharge Instructions (Signed)
Begin clear liquids (Pedialyte while having diarrhea) until improved, then advance to a BRAT diet (Bananas, Rice, Applesauce, Toast).  Then gradually resume a regular diet when tolerated.  Avoid milk products until well.  When stools become more formed, may take Imodium (loperamide) once or twice daily to decrease stool frequency.  °If symptoms become significantly worse during the night or over the weekend, proceed to the local emergency room.  °

## 2016-03-17 NOTE — ED Notes (Signed)
Pt c/o diarrhea, epigastric pain, nausea and vomiting x this morning. He took Pepto Bismol without relief. Denies fever.

## 2016-03-20 ENCOUNTER — Telehealth: Payer: Self-pay

## 2017-03-04 ENCOUNTER — Ambulatory Visit (INDEPENDENT_AMBULATORY_CARE_PROVIDER_SITE_OTHER): Payer: Managed Care, Other (non HMO) | Admitting: Family Medicine

## 2017-03-04 VITALS — BP 133/70 | HR 67 | Wt 153.0 lb

## 2017-03-04 DIAGNOSIS — Z0000 Encounter for general adult medical examination without abnormal findings: Secondary | ICD-10-CM

## 2017-03-04 DIAGNOSIS — R0789 Other chest pain: Secondary | ICD-10-CM

## 2017-03-04 NOTE — Patient Instructions (Signed)
Thank you for coming in today. Let me know if the pressure continues. If you continue to have symptoms we will do more testing.  If all is well we will recheck in 1 year or sooner if needed.   Call or go to the emergency room if you get worse, have trouble breathing, have chest pains, or palpitations.     Dolor de pecho inespecfico (Nonspecific Chest Pain) El dolor de pecho puede deberse a muchas enfermedades diferentes. Siempre existe una posibilidad de que el dolor est relacionado con algo grave, como un infarto de miocardio o un cogulo sanguneo en los pulmones. Hay muchas enfermedades que no son potencialmente mortales que pueden causar dolor de [place]. Si tiene Engineer, miningdolor de Hotel managerpecho, es muy importante que se controle con el mdico. CAUSAS Las causas del dolor de pecho pueden ser las siguientes:  Acidez estomacal.  Neumona o bronquitis.  Ansiedad o estrs.  Inflamacin de la zona que rodea al corazn (pericarditis) o a los pulmones (pleuritis o pleuresa).  Un cogulo sanguneo en el pulmn.  Colapso de un pulmn (neumotrax), que puede aparecer de Regions Financial Corporationmanera repentina por s solo (neumotrax espontneo) o debido a un traumatismo en el trax.  Culebrilla (virus de la varicela zster).  Infarto de miocardio.  Dao de los Byronhuesos, los msculos y los cartlagos que conforman la pared torcica. Esto puede incluir lo siguiente:  Hematomas seos debido a lesiones.  Distensiones musculares o de los cartlagos por tos frecuente o repetida, o por exceso de trabajo.  Fractura de una o ms costillas.  Dolor de TEFL teachercartlago debido a inflamacin (costocondritis). FACTORES DE RIESGO Los factores de riesgo de tener dolor de pecho pueden incluir lo siguiente:  Actividades que incrementan el riesgo de sufrir traumatismos o lesiones en el trax.  Infecciones o enfermedades respiratorias que causan tos frecuente.  Enfermedades o Eastman Kodakexcesos en las comidas que pueden causar Engineering geologistacidez.  Enfermedades  cardacas o antecedentes familiares de enfermedades cardacas.  Enfermedades o comportamientos de salud que aumentan el riesgo de tener un cogulo sanguneo.  Haber tenido varicela (varicela zster). SIGNOS Y SNTOMAS El dolor de pecho puede provocar las siguientes sensaciones:  Ardor u hormigueo en la superficie o en lo profundo del pecho.  Dolor opresivo, continuo o constrictivo.  Dolor vago o intenso que empeora al Clorox Companymoverse, toser o inhalar profundamente.  Dolor que tambin se siente en la espalda, el cuello, el hombro o el brazo, o dolor que se irradia a cualquiera de estas zonas. El dolor de pecho puede aparecer y Geneticist, moleculardesaparecer, o bien puede ser constante. DIAGNSTICO Gretchen ShortQuizs se necesiten anlisis de laboratorio u otros estudios para Veterinary surgeonencontrar la causa del Engineer, miningdolor. El mdico puede indicarle que se haga una prueba llamada EGC (electrocadiograma) ambulatorio. El Regulatory affairs officerelectrocardiograma registra los patrones de los latidos cardacos en el momento en que se realiza el [place] Leonardestudio. Tambin pueden hacerle otros estudios, por ejemplo:  Ecocardiograma transtorcico (ETT). Durante el ecocardiograma, se usan ondas sonoras para crear una imagen de todas las estructuras cardacas y evaluar cmo circula la sangre por el corazn.  Ecocardiograma transesofgico (ETE).Este es un estudio de diagnstico por imgenes ms avanzado que el obtiene imgenes del interior del cuerpo. Le permite al mdico ver el corazn con mayor detalle.  Monitoreo cardaco. Permite que el mdico controle la frecuencia y el ritmo cardaco en tiempo real.  Monitor Holter. Es un dispositivo porttil que eBayregistra los latidos del corazn y puede ayudar a Education administratordiagnosticar las arritmias cardacas. Le permite al American Expressmdico registrar la actividad cardaca durante varios  das, si es necesario.  Pruebas de esfuerzo. Estas pueden realizarse durante el ejercicio o mediante la administracin de un medicamento que acelera los latidos del corazn.  Anlisis de  [place].  Diagnstico por imgenes. TRATAMIENTO El tratamiento depende de la causa del dolor de [place]. El tratamiento puede incluir lo siguiente:  Medicamentos. Estos pueden incluir lo siguiente:  Inhibidores de Publishing copy.  Antiinflamatorios.  Analgsicos para las enfermedades inflamatorias.  Antibiticos, si hay una infeccin.  Medicamentos para [employer].  Medicamentos para tratar la enfermedad arterial coronaria.  Tratamiento complementario para las enfermedades que no requieren la toma de medicamentos. Esto puede incluir lo siguiente:  Descansar.  Aplicar compresas fras o calientes en las zonas lesionadas.  Limitar las actividades hasta que [employer]. INSTRUCCIONES PARA EL CUIDADO EN EL HOGAR  Si le recetaron antibiticos, asegrese de terminarlos, incluso si comienza a sentirse mejor.  Evite las [employer] causen dolor de [place].  No consuma ningn producto que contenga tabaco, lo que incluye cigarrillos, tabaco de Theatre manager o Administrator, Civil Service. Si necesita ayuda para dejar de fumar, consulte al mdico.  No beba alcohol.  Tome los medicamentos solamente como se lo haya indicado el mdico.  Concurra a todas las visitas de control como se lo haya indicado el mdico. Esto es importante. Esto incluye otros estudios si el dolor de pecho no desaparece.  Si la acidez es la causa del dolor de [place], tal vez le aconsejen que mantenga la cabeza levantada (elevada) mientras duerme. Esto reduce la probabilidad de que el cido retroceda del estmago al esfago.  Haga cambios en su estilo de vida como se lo haya indicado el mdico. Estos pueden incluir lo siguiente:  Education administrator actividad fsica con regularidad. Pida al mdico que le sugiera algunas actividades que sean seguras para usted.  Consumir una dieta cardiosaludable. Un nutricionista matriculado puede ayudarlo a Software engineer saludables.  Mantener un peso  saludable.  Controlar la diabetes, si es necesario.  Reducir las situaciones de estrs. SOLICITE ATENCIN MDICA SI:  El dolor de pecho no desaparece despus del tratamiento.  Tiene una erupcin cutnea con ampollas en el pecho.  Tiene fiebre. SOLICITE ATENCIN MDICA DE INMEDIATO SI:  El dolor en el pecho es ms intenso.  La tos empeora, o expectora sangre.  Siente un dolor abdominal intenso.  Siente debilidad intensa.  Se desmaya.  Tiene escalofros.  Tiene una molestia repentina e inexplicable en el pecho.  Tiene molestias repentinas e [employer], la espalda, el cuello o la [place].  Le falta el aire en cualquier momento.  Comienza a sudar de [place] repentina o la piel se le humedece.  Siente nuseas o vomita.  Se siente repentinamente mareado o se desmaya.  Siente que el corazn comienza a latir rpidamente o que se saltea latidos. Estos sntomas pueden representar un problema grave que constituye Radio broadcast assistant. No espere hasta que los sntomas desaparezcan. Solicite atencin mdica de inmediato. Comunquese con el servicio de emergencias de su localidad (911 en los [place]). No conduzca por sus propios medios [employer]. Esta informacin no tiene Theme park manager el consejo del mdico. Asegrese de hacerle al mdico cualquier pregunta que tenga. Document Released: 12/08/2005 Document Revised: 12/29/2014 Document Reviewed: 06/16/2016 Elsevier Interactive Patient Education  2017 [employer].

## 2017-03-04 NOTE — Progress Notes (Signed)
Casey Bowers is a 31 y.o. male [name] today for well adult visit and discuss chest pressure.   Patient is well with only mild issues. He notes occasional chest pressure. This is occurring off and on for years however recently he notes he had a 10 minute episode of central chest pressure occurring at rest. He denies palpitations radiating pain or exertional pain. He denies shortness of breath and leg swelling or weakness. He feels well otherwise. No fevers or chills.   Past Medical History:  Diagnosis Date  . Hyperlipidemia    No past surgical history on file. Social History  Substance Use Topics  . Smoking status: Never Smoker  . Smokeless tobacco: Never Used  . Alcohol use 0.0 oz/week   family history is not on file.  ROS as above:  Medications: Current Outpatient Prescriptions  Medication Sig Dispense Refill  . ondansetron (ZOFRAN ODT) 4 MG disintegrating tablet Take one tab by mouth Q6hr prn nausea 12 tablet 0  . SUMAtriptan (IMITREX) 50 MG tablet Take 1 tablet (50 mg total) by mouth every 2 (two) hours as needed for migraine. May repeat in 2 hours if headache persists or recurs. 10 tablet 0   No current facility-administered medications for this visit.    No Known Allergies  Health Maintenance Health Maintenance  Topic Date Due  . INFLUENZA VACCINE  07/22/2016  . TETANUS/TDAP  02/11/2026  . HIV Screening  Completed     Exam:  BP 133/70   Pulse 67   Wt 153 lb (69.4 kg)   BMI 23.26 kg/m  Gen: Well NAD HEENT: EOMI,  MMM Lungs: Normal work of breathing. CTABL Heart: RRR no MRG Abd: NABS, Soft. Nondistended, Nontender Exts: Brisk capillary refill, warm and well perfused.   12-lead EKG shows normal sinus rhythm at 60 bpm. No ST segment elevation or depression. Normal EKG.  Lab Results  Component Value Date   CHOL 172 02/12/2016   HDL 32 (L) 02/12/2016   LDLCALC 101 02/12/2016   TRIG 196 (H) 02/12/2016   CHOLHDL 5.4 (H) 02/12/2016     Chemistry      Component Value Date/Time   NA 140 02/12/2016 1026   K 3.7 02/12/2016 1026   CL 103 02/12/2016 1026   CO2 25 02/12/2016 1026   BUN 12 02/12/2016 1026   CREATININE 0.79 02/12/2016 1026      Component Value Date/Time   CALCIUM 9.6 02/12/2016 1026   ALKPHOS 84 02/12/2016 1026   AST 17 02/12/2016 1026   ALT 19 02/12/2016 1026   BILITOT 0.5 02/12/2016 1026       No results found for this or any previous visit (from the past 72 hour(s)). No results found.    Assessment and Plan: 31 y.o. male with  Well adult. Doing well. Plan for basic workup of chest pain. [employer] form filled out. We'll plan on rechecking lipids in a year to area and otherwise if chest x-ray is reassuring and symptoms don't worsen plan for watchful waiting.   Orders Placed This Encounter  Procedures  . DG Chest 2 View    Order Specific Question:   Reason for exam:    Answer:   chest pressure/pain    Order Specific Question:   [facility]?    [facility]  . EKG 12-Lead   No orders of the defined types were placed in this encounter.  Discussed warning signs or symptoms. Please see discharge instructions. Patient expresses understanding.

## 2017-06-12 ENCOUNTER — Ambulatory Visit (INDEPENDENT_AMBULATORY_CARE_PROVIDER_SITE_OTHER): Payer: Managed Care, Other (non HMO) | Admitting: Family Medicine

## 2017-06-12 ENCOUNTER — Encounter: Payer: Self-pay | Admitting: Family Medicine

## 2017-06-12 ENCOUNTER — Ambulatory Visit (INDEPENDENT_AMBULATORY_CARE_PROVIDER_SITE_OTHER): Payer: Managed Care, Other (non HMO)

## 2017-06-12 VITALS — BP 117/70 | HR 71 | Temp 98.3°F | Wt 153.0 lb

## 2017-06-12 DIAGNOSIS — R05 Cough: Secondary | ICD-10-CM

## 2017-06-12 DIAGNOSIS — R059 Cough, unspecified: Secondary | ICD-10-CM

## 2017-06-12 MED ORDER — AZITHROMYCIN 250 MG PO TABS: 250.0000 mg | ORAL_TABLET | Freq: Every day | ORAL | 0 refills | Status: DC

## 2017-06-12 MED ORDER — BENZONATATE 200 MG PO CAPS: 200.0000 mg | ORAL_CAPSULE | Freq: Three times a day (TID) | ORAL | 0 refills | Status: DC | PRN

## 2017-06-12 MED ORDER — GUAIFENESIN-CODEINE 100-10 MG/5ML PO SOLN: 5.0000 mL | Freq: Every evening | ORAL | 0 refills | Status: DC | PRN

## 2017-06-12 MED ORDER — IPRATROPIUM BROMIDE 0.06 % NA SOLN: 2.0000 | NASAL | 6 refills | Status: DC | PRN

## 2017-06-12 NOTE — Progress Notes (Signed)
Casey Bowers is a 31 y.o. male [facility] today for cough. Patient has a two-week history of cough and congestion. The cough is sometimes productive and will sometimes keeping up at night. He denies wheezing fevers chills nausea vomiting diarrhea or shortness of breath. No chest pain or palpitations. He has tried some over-the-counter medications which have helped a little. He denies sick contacts.   Past Medical History:  Diagnosis Date  . Hyperlipidemia    No past surgical history on file. Social History  Substance Use Topics  . Smoking status: Never Smoker  . Smokeless tobacco: Never Used  . Alcohol use 0.0 oz/week   family history is not on file.  ROS as above:  Medications: Current Outpatient Prescriptions  Medication Sig Dispense Refill  . azithromycin (ZITHROMAX) 250 MG tablet Take 1 tablet (250 mg total) by mouth daily. Take first 2 tablets together, then 1 every day until finished. 6 tablet 0  . benzonatate (TESSALON) 200 MG capsule Take 1 capsule (200 mg total) by mouth 3 (three) times daily as needed for cough. 45 capsule 0  . guaiFENesin-codeine 100-10 MG/5ML syrup Take 5 mLs by mouth at bedtime as needed for cough. 120 mL 0  . ipratropium (ATROVENT) 0.06 % nasal spray Place 2 sprays into both nostrils every 4 (four) hours as needed for rhinitis. 10 mL 6  . ondansetron (ZOFRAN ODT) 4 MG disintegrating tablet Take one tab by mouth Q6hr prn nausea 12 tablet 0  . SUMAtriptan (IMITREX) 50 MG tablet Take 1 tablet (50 mg total) by mouth every 2 (two) hours as needed for migraine. May repeat in 2 hours if headache persists or recurs. 10 tablet 0   No current facility-administered medications for this visit.    No Known Allergies  Health Maintenance Health Maintenance  Topic Date Due  . INFLUENZA VACCINE  07/22/2017  .  TETANUS/TDAP  02/11/2026  . HIV Screening  Completed     Exam:  BP 117/70   Pulse 71   Temp 98.3 F (36.8 C) (Oral)   Wt 153 lb (69.4 kg)   SpO2 96%   BMI 23.26 kg/m  Gen: Well NAD HEENT: EOMI,  MMM Posterior pharynx with cobblestoning and nasal discharge. Normal tympanic membranes bilaterally. Lungs: Normal work of breathing. Coarse breath sounds left lower lung Heart: RRR no MRG Abd: NABS, Soft. Nondistended, Nontender Exts: Brisk capillary refill, warm and well perfused.    No results found for this or any previous visit (from the past 72 hour(s)). Dg Chest 2 View  Result Date: 06/12/2017 CLINICAL DATA:  Cough for 2 weeks EXAM: CHEST  2 VIEW COMPARISON:  None. FINDINGS: The heart size and mediastinal contours are within normal limits. Both lungs are clear. The visualized skeletal structures are unremarkable. IMPRESSION: No active cardiopulmonary disease. Electronically Signed   By: Alcide CleverMark  Lukens M.D.   On: 06/12/2017 10:24      Assessment and Plan: 31 y.o. male with viral bronchitis. Treat with conservative measures including over-the-counter medications Atrovent nasal spray Tessalon and codeine cough syrup. Reserve azithromycin for use if not better.   Orders Placed This Encounter  Procedures  . DG Chest 2 View    Order Specific Question:   Reason for exam:    Answer:   Cough, assess intra-thoracic pathology    Order Specific Question:   Preferred imaging location?    Answer:   [facility]   Meds  ordered this encounter  Medications  . ipratropium (ATROVENT) 0.06 % nasal spray    Sig: Place 2 sprays into both nostrils every 4 (four) hours as needed for rhinitis.    Dispense:  10 mL    Refill:  6  . azithromycin (ZITHROMAX) 250 MG tablet    Sig: Take 1 tablet (250 mg total) by mouth daily. Take first 2 tablets together, then 1 every day until finished.    Dispense:  6 tablet    Refill:  0  . guaiFENesin-codeine 100-10 MG/5ML syrup    Sig: Take 5 mLs by  mouth at bedtime as needed for cough.    Dispense:  120 mL    Refill:  0  . benzonatate (TESSALON) 200 MG capsule    Sig: Take 1 capsule (200 mg total) by mouth 3 (three) times daily as needed for cough.    Dispense:  45 capsule    Refill:  0     Discussed warning signs or symptoms. Please see discharge instructions. Patient expresses understanding.  Patient researched [facility] Controlled Substance Reporting System.

## 2017-06-12 NOTE — Patient Instructions (Addendum)
Thank you for coming in today. Get the xray today.  We will contact you with results.  Use the nasal spray.  Take the cough pills during the day and the liquid at night.   Call or go to the emergency room if you get worse, have trouble breathing, have chest pains, or palpitations.    Acute Bronchitis, Adult Acute bronchitis is when air tubes (bronchi) in the lungs suddenly get swollen. The condition can make it hard to breathe. It can also cause these symptoms:  A cough.  Coughing up clear, yellow, or green mucus.  Wheezing.  Chest congestion.  Shortness of breath.  A fever.  Body aches.  Chills.  A sore throat.  Follow these instructions at home: Medicines  Take over-the-counter and prescription medicines only as told by your doctor.  If you were prescribed an antibiotic medicine, take it as told by your doctor. Do not stop taking the antibiotic even if you start to feel better. General instructions  Rest.  Drink enough fluids to keep your pee (urine) clear or pale yellow.  Avoid smoking and secondhand smoke. If you smoke and you need help quitting, ask your doctor. Quitting will help your lungs heal faster.  Use an inhaler, cool mist vaporizer, or humidifier as told by your doctor.  Keep all follow-up visits as told by your doctor. This is important. How is this prevented? To lower your risk of getting this condition again:  Wash your hands often with soap and water. If you cannot use soap and water, use hand sanitizer.  Avoid contact with people who have cold symptoms.  Try not to touch your hands to your mouth, nose, or eyes.  Make sure to get the flu shot every year.  Contact a doctor if:  Your symptoms do not get better in 2 weeks. Get help right away if:  You cough up blood.  You have chest pain.  You have very bad shortness of breath.  You become dehydrated.  You faint (pass out) or keep feeling like you are going to pass out.  You keep  throwing up (vomiting).  You have a very bad headache.  Your fever or chills gets worse. This information is not intended to replace advice given to you by your health care provider. Make sure you discuss any questions you have with your health care provider. Document Released: 05/26/2008 Document Revised: 07/16/2016 Document Reviewed: 05/28/2016 Elsevier Interactive Patient Education  2017 [employer]

## 2018-02-11 ENCOUNTER — Encounter: Payer: Self-pay | Admitting: Family Medicine

## 2018-02-11 ENCOUNTER — Telehealth: Payer: Self-pay | Admitting: Family Medicine

## 2018-02-11 ENCOUNTER — Ambulatory Visit (INDEPENDENT_AMBULATORY_CARE_PROVIDER_SITE_OTHER): Payer: 59

## 2018-02-11 ENCOUNTER — Ambulatory Visit (INDEPENDENT_AMBULATORY_CARE_PROVIDER_SITE_OTHER): Payer: 59 | Admitting: Family Medicine

## 2018-02-11 VITALS — BP 118/74 | HR 83 | Temp 97.9°F | Wt 163.0 lb

## 2018-02-11 DIAGNOSIS — R140 Abdominal distension (gaseous): Secondary | ICD-10-CM

## 2018-02-11 DIAGNOSIS — R1030 Lower abdominal pain, unspecified: Secondary | ICD-10-CM

## 2018-02-11 LAB — POCT URINALYSIS DIPSTICK
Bilirubin, UA: NEGATIVE
Glucose, UA: NEGATIVE
Ketones, UA: NEGATIVE
LEUKOCYTES UA: NEGATIVE
NITRITE UA: NEGATIVE
Spec Grav, UA: 1.025 (ref 1.010–1.025)
Urobilinogen, UA: 0.2 E.U./dL
pH, UA: 5.5 (ref 5.0–8.0)

## 2018-02-11 LAB — CBC WITH DIFFERENTIAL/PLATELET
BASOS ABS: 15 {cells}/uL (ref 0–200)
Basophils Relative: 0.1 %
EOS PCT: 1.1 %
Eosinophils Absolute: 168 cells/uL (ref 15–500)
HEMATOCRIT: 45.4 % (ref 38.5–50.0)
Hemoglobin: 16.1 g/dL (ref 13.2–17.1)
LYMPHS ABS: 1714 {cells}/uL (ref 850–3900)
MCH: 32.2 pg (ref 27.0–33.0)
MCHC: 35.5 g/dL (ref 32.0–36.0)
MCV: 90.8 fL (ref 80.0–100.0)
MONOS PCT: 9.2 %
MPV: 10.3 fL (ref 7.5–12.5)
Neutro Abs: 11995 cells/uL — ABNORMAL HIGH (ref 1500–7800)
Neutrophils Relative %: 78.4 %
PLATELETS: 251 10*3/uL (ref 140–400)
RBC: 5 10*6/uL (ref 4.20–5.80)
RDW: 13.6 % (ref 11.0–15.0)
Total Lymphocyte: 11.2 %
WBC mixed population: 1408 cells/uL — ABNORMAL HIGH (ref 200–950)
WBC: 15.3 10*3/uL — AB (ref 3.8–10.8)

## 2018-02-11 LAB — COMPLETE METABOLIC PANEL WITH GFR
AG Ratio: 1.9 (calc) (ref 1.0–2.5)
ALKALINE PHOSPHATASE (APISO): 100 U/L (ref 40–115)
ALT: 24 U/L (ref 9–46)
AST: 18 U/L (ref 10–40)
Albumin: 4.4 g/dL (ref 3.6–5.1)
BUN: 17 mg/dL (ref 7–25)
CO2: 29 mmol/L (ref 20–32)
CREATININE: 0.8 mg/dL (ref 0.60–1.35)
Calcium: 9.2 mg/dL (ref 8.6–10.3)
Chloride: 101 mmol/L (ref 98–110)
GFR, Est African American: 138 mL/min/{1.73_m2} (ref >=60)
GFR, Est Non African American: 119 mL/min/{1.73_m2} (ref >=60)
GLOBULIN: 2.3 g/dL (ref 1.9–3.7)
Glucose, Bld: 102 mg/dL — ABNORMAL HIGH (ref 65–99)
POTASSIUM: 3.8 mmol/L (ref 3.5–5.3)
SODIUM: 137 mmol/L (ref 135–146)
Total Bilirubin: 0.8 mg/dL (ref 0.2–1.2)
Total Protein: 6.7 g/dL (ref 6.1–8.1)

## 2018-02-11 MED ORDER — CIPROFLOXACIN HCL 500 MG PO TABS: 500.0000 mg | ORAL_TABLET | Freq: Two times a day (BID) | ORAL | 0 refills | Status: DC

## 2018-02-11 MED ORDER — METRONIDAZOLE 500 MG PO TABS: 500.0000 mg | ORAL_TABLET | Freq: Two times a day (BID) | ORAL | 0 refills | Status: DC

## 2018-02-11 MED ORDER — IOPAMIDOL (ISOVUE-300) INJECTION 61%
100.0000 mL | Freq: Once | INTRAVENOUS | Status: AC | PRN
  Administered 2018-02-11: 100 mL via INTRAVENOUS

## 2018-02-11 NOTE — Telephone Encounter (Signed)
CT scan shows diverticulitis which is an infection of the colon.  We should be able to treat this with oral antibiotics.  I sent in ciprofloxacin and metronidazole to the [facility] on [address] [place].  Please start taking both of these twice daily.  Do not drink alcohol while taking metronidazole.  We will give you the lab results when I get them.  Recheck Monday if not better.  Return Friday if worsening.  Go to the emergency room if you get worse.

## 2018-02-11 NOTE — Patient Instructions (Signed)
Thank you for coming in today. Get labs and CT scan now.  I will call you with results tonight.  Make sure you phone is on and it works.  Make sure to answer your phone.    Appendicitis The appendix is a tube that is shaped like a finger. It is connected to the large intestine. Appendicitis means that this tube is swollen (inflamed). Without treatment, the tube can tear (rupture). This can lead to a life-threatening infection. It can also cause you to have sores (abscesses). These sores hurt. What are the causes? This condition may be caused by something that blocks the appendix, such as:  A ball of poop (stool).  Lymph glands that are bigger than normal.  Sometimes, the cause is not known. What are the signs or symptoms? Symptoms of this condition include:  Pain around the belly button (navel). ? The pain moves toward the lower right belly (abdomen). ? The pain can get worse with time. ? The pain can get worse if you cough. ? The pain can get worse if you move suddenly.  Tenderness in the lower right belly.  Feeling sick to your stomach (nauseous).  Throwing up (vomiting).  Not feeling hungry (loss of appetite).  A fever.  Having a hard time pooping (constipation).  Watery poop (diarrhea).  Not feeling well.  How is this treated? Usually, this condition is treated by taking out the appendix (appendectomy). There are two ways that the appendix can be taken out:  Open surgery. In this surgery, the appendix is taken out through a large cut (incision). The cut is made in the lower right belly. This surgery may be picked if: ? You have scars from another surgery. ? You have a bleeding condition. ? You are pregnant and will be having your baby soon. ? You have a condition that does not allow the other type of surgery.  Laparoscopic surgery. In this surgery, the appendix is taken out through small cuts. Often, this surgery: ? Causes less pain. ? Causes fewer  problems. ? Is easier to heal from.  If your appendix tears and a sore forms:  A drain may be put into the sore. The drain will be used to get rid of fluid.  You may get an antibiotic medicine through an IV tube.  Your appendix may or may not need to be taken out.  This information is not intended to replace advice given to you by your health care provider. Make sure you discuss any questions you have with your health care provider. Document Released: 03/01/2012 Document Revised: 05/15/2016 Document Reviewed: 04/25/2015 Elsevier Interactive Patient Education  [employer]2018 Elsevier Inc.

## 2018-02-11 NOTE — Progress Notes (Signed)
Casey Bowers is a 32 y.o. male who presents to [facility]: Primary Care Sports Medicine today for abdominal pain. Casey Bowers notes a 1 day history of central to right lower abdominal pain. The pain started yesterday and is worsening. He notes 1 episode of diarrhea yesterday but no blood in the stool. He denies any vomiting or fever or chills. He denies any urinary symptoms or scrotal bulging. He tried some Ibuprofen and heat which helped a bit. He denies any significant surgical history.    Past Medical History:  Diagnosis Date  . Hyperlipidemia    No past surgical history on file. Social History   Tobacco Use  . Smoking status: Never Smoker  . Smokeless tobacco: Never Used  Substance Use Topics  . Alcohol use: Yes    Alcohol/week: 0.0 oz   family history is not on file.  ROS as above: No headache, visual changes, nausea, vomiting, , constipation, dizziness, , skin rash, fevers, chills, night sweats, weight loss, swollen lymph nodes, body aches, joint swelling, muscle aches, chest pain, shortness of breath, mood changes, visual or auditory hallucinations.   Medications: Current Outpatient Medications  Medication Sig Dispense Refill  . ipratropium (ATROVENT) 0.06 % nasal spray Place 2 sprays into both nostrils every 4 (four) hours as needed for rhinitis. 10 mL 6  . ondansetron (ZOFRAN ODT) 4 MG disintegrating tablet Take one tab by mouth Q6hr prn nausea 12 tablet 0  . SUMAtriptan (IMITREX) 50 MG tablet Take 1 tablet (50 mg total) by mouth every 2 (two) hours as needed for migraine. May repeat in 2 hours if headache persists or recurs. 10 tablet 0   No current facility-administered medications for this visit.    No Known Allergies  Health Maintenance Health Maintenance  Topic Date Due  . INFLUENZA VACCINE  10/14/2018 (Originally 07/22/2017)  . TETANUS/TDAP  02/11/2026  . HIV  Screening  Completed     Exam:  BP 118/74   Pulse 83   Temp 97.9 F (36.6 C) (Oral)   Wt 163 lb (73.9 kg)   BMI 24.78 kg/m  Gen: Well NAD HEENT: EOMI,  MMM Lungs: Normal work of breathing. CTABL Heart: RRR no MRG Abd: NABS, Soft. Nondistended, TTP central to right lower quadrant with rebound and guarding. Positive left Psoas sign.  Exts: Brisk capillary refill, warm and well perfused.  Genitals. No scrotal swelling or mass. No hernia. No penile discharge.   Results for orders placed or performed in visit on 02/11/18 (from the past 72 hour(s))  POCT Urinalysis Dipstick     Status: None   Collection Time: 02/11/18 11:46 AM  Result Value Ref Range   Color, UA yellow    Clarity, UA clear    Glucose, UA negative    Bilirubin, UA negative    Ketones, UA negative    Spec Grav, UA 1.025 1.010 - 1.025   Blood, UA small    pH, UA 5.5 5.0 - 8.0   Protein, UA trace    Urobilinogen, UA 0.2 0.2 or 1.0 E.U./dL   Nitrite, UA negative    Leukocytes, UA Negative Negative   Appearance     Odor     No results found.    Assessment and Plan: 32 y.o. male with severe new abdominal pain with rebound and guarding is concerning for peritonitis due to appendicitis or diverticulitis.  Plan for STAT Ct abd and pelvis as well as STAT CBC and CMP. Urine studies are  pending.  I will contact Casey Bowers this evening with results and treatment plan. Precautions reviewed.   Orders Placed This Encounter  Procedures  . Urine Culture  . CT Abdomen Pelvis W Contrast    Standing Status:   Future    Standing Expiration Date:   05/12/2019    Order Specific Question:   If indicated for the ordered procedure, I authorize the administration of contrast media per Radiology protocol    Answer:   Yes    Order Specific Question:   Preferred imaging [facility]?    [facility]    Order Specific Question:   Call Results- Best Contact Number?    Answer:   Casey Bowers cell [phone]    Order  Specific Question:   Radiology Contrast Protocol - do NOT remove file path    Answer:   [email or web address]  . CBC with Differential/Platelet  . COMPLETE METABOLIC PANEL WITH GFR  . POCT Urinalysis Dipstick   No orders of the defined types were placed in this encounter.    Discussed warning signs or symptoms. Please see discharge instructions. Patient expresses understanding.

## 2018-02-12 LAB — URINE CULTURE
MICRO NUMBER:: 90231641
SPECIMEN QUALITY: ADEQUATE

## 2018-02-12 NOTE — Telephone Encounter (Signed)
Patient has been advised of results and he verbally understands. Cheryle Dark,CMA

## 2018-04-06 ENCOUNTER — Encounter: Payer: Self-pay | Admitting: Family Medicine

## 2018-04-06 ENCOUNTER — Ambulatory Visit (INDEPENDENT_AMBULATORY_CARE_PROVIDER_SITE_OTHER): Payer: 59 | Admitting: Family Medicine

## 2018-04-06 VITALS — HR 71 | Temp 97.3°F | Ht 68.5 in | Wt 158.0 lb

## 2018-04-06 DIAGNOSIS — Z0000 Encounter for general adult medical examination without abnormal findings: Secondary | ICD-10-CM

## 2018-04-06 LAB — COMPLETE METABOLIC PANEL WITH GFR
AG RATIO: 1.9 (calc) (ref 1.0–2.5)
ALT: 24 U/L (ref 9–46)
AST: 19 U/L (ref 10–40)
Albumin: 4.5 g/dL (ref 3.6–5.1)
Alkaline phosphatase (APISO): 82 U/L (ref 40–115)
BILIRUBIN TOTAL: 0.4 mg/dL (ref 0.2–1.2)
BUN: 11 mg/dL (ref 7–25)
CALCIUM: 9.5 mg/dL (ref 8.6–10.3)
CO2: 31 mmol/L (ref 20–32)
Chloride: 106 mmol/L (ref 98–110)
Creat: 0.87 mg/dL (ref 0.60–1.35)
GFR, EST AFRICAN AMERICAN: 133 mL/min/{1.73_m2} (ref >=60)
GFR, EST NON AFRICAN AMERICAN: 115 mL/min/{1.73_m2} (ref >=60)
GLOBULIN: 2.4 g/dL (ref 1.9–3.7)
Glucose, Bld: 109 mg/dL — ABNORMAL HIGH (ref 65–99)
POTASSIUM: 4.1 mmol/L (ref 3.5–5.3)
SODIUM: 142 mmol/L (ref 135–146)
TOTAL PROTEIN: 6.9 g/dL (ref 6.1–8.1)

## 2018-04-06 LAB — LIPID PANEL W/REFLEX DIRECT LDL
Cholesterol: 182 mg/dL (ref <200)
HDL: 38 mg/dL — ABNORMAL LOW (ref >40)
LDL Cholesterol (Calc): 110 mg/dL (calc) — ABNORMAL HIGH
NON-HDL CHOLESTEROL (CALC): 144 mg/dL — AB (ref <130)
Total CHOL/HDL Ratio: 4.8 (calc) (ref <5.0)
Triglycerides: 222 mg/dL — ABNORMAL HIGH (ref <150)

## 2018-04-06 NOTE — Patient Instructions (Signed)
Thank you for coming in today. Get labs now.  I will send in the form when we get labs back.  Recheck yearly or sooner if needed.

## 2018-04-06 NOTE — Progress Notes (Signed)
       Casey Bowers is a 32 y.o. male who [facility] [facility] today for well adult visit and biometric physical.   Casey Bowers reports that he is in good health with no complaints. His work requires a biometric physical. He reports his asthma and migraines are well controlled. He states he drinks alcohol approximately once a month and never drinks 6 or more drinks at once.   Past Medical History:  Diagnosis Date  . Hyperlipidemia    No past surgical history on file. Social History   Tobacco Use  . Smoking status: Never Smoker  . Smokeless tobacco: Never Used  Substance Use Topics  . Alcohol use: Yes    Alcohol/week: 0.0 oz   family history is not on file.  ROS as above:  Medications: No current outpatient medications on file.   No current facility-administered medications for this visit.    No Known Allergies  Health Maintenance Health Maintenance  Topic Date Due  . INFLUENZA VACCINE  10/14/2018 (Originally 07/22/2018)  . TETANUS/TDAP  02/11/2026  . HIV Screening  Completed     Exam:  Pulse 71   Temp (!) 97.3 F (36.3 C) (Oral)   Ht 5' 8.5" (1.74 m)   Wt 158 lb (71.7 kg)   BMI 23.67 kg/m  Gen: Well NAD HEENT: EOMI,  MMM Lungs: Normal work of breathing. CTABL Heart: RRR no MRG Abd: NABS, Soft. Nondistended, Nontender Exts: Brisk capillary refill, warm and well perfused.  [facility] 04/06/2018 02/12/2018  Decreased Interest 0 0  Down, Depressed, Hopeless 1 0  PHQ - 2 Score 1 0     Office Visit from 04/06/2018 in [facility]  AUDIT-C Score  1        Assessment and Plan: [age] y.o. male with well adult visit.   Doing well without any issues.  We completed blood work and vitals for his work form. He should come back in a year for another health maintenance visit.    Orders  Placed This Encounter  Procedures  . COMPLETE METABOLIC PANEL WITH GFR  . Lipid Panel w/reflex Direct LDL   No orders of the defined types were placed in this encounter.    Discussed warning signs or symptoms. Please see discharge instructions. Patient expresses understanding.

## 2018-04-07 ENCOUNTER — Encounter: Payer: Self-pay | Admitting: Family Medicine

## 2018-04-07 DIAGNOSIS — R7301 Impaired fasting glucose: Secondary | ICD-10-CM | POA: Insufficient documentation

## 2018-04-07 DIAGNOSIS — E785 Hyperlipidemia, unspecified: Secondary | ICD-10-CM | POA: Insufficient documentation

## 2018-04-13 ENCOUNTER — Encounter: Payer: Self-pay | Admitting: Family Medicine

## 2019-09-22 IMAGING — CT CT ABD-PELV W/ CM
2 of 4 series · 16 of 46 positions shown, 18 images · IV contrast (APPLIED)
Comparison: None.

CLINICAL DATA: Lower abdominal pain and tenderness with bloating
since yesterday. Decreased appetite.

EXAM:
CT ABDOMEN AND PELVIS WITH CONTRAST
TECHNIQUE: Multidetector CT imaging of the abdomen and pelvis was performed
using the standard protocol following bolus administration of
intravenous contrast.
CONTRAST:  100mL [id number] IOPAMIDOL (J46KIT-[id number]) INJECTION 61%

[Series 2: axial st · axial · 0.70mm/px · z∈[-681,-181]mm · 13 of 110 slices shown, 15 images]
[im 5/110  soft-tissue]
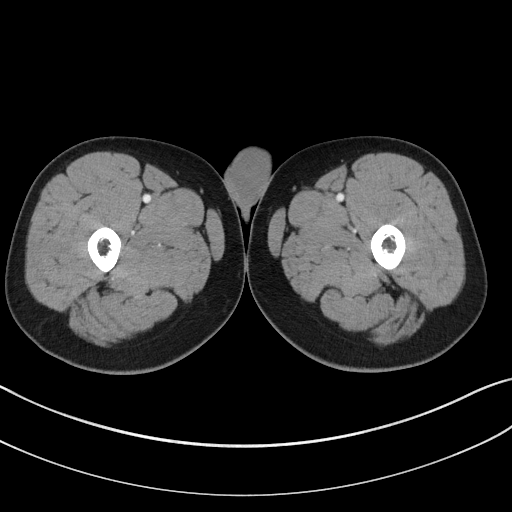
[im 5/110  bone]
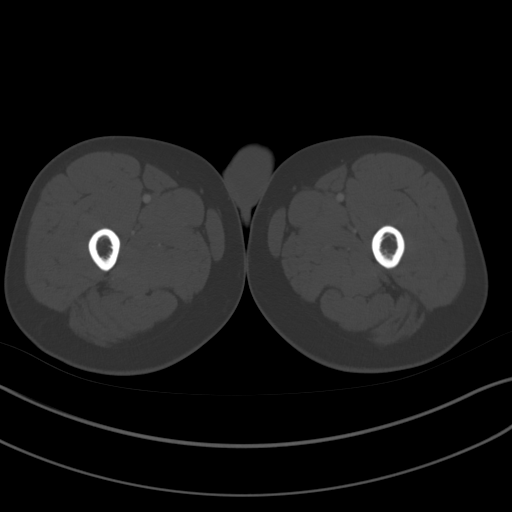
[im 14/110  soft-tissue]
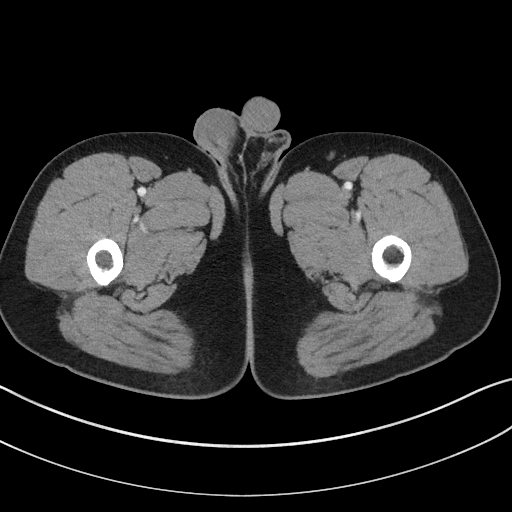
[im 22/110  soft-tissue]
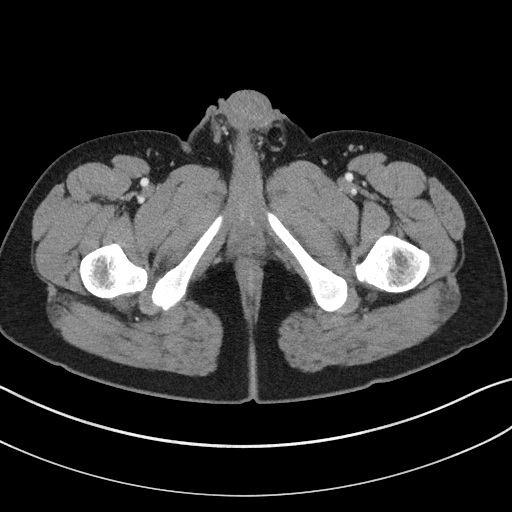
[im 31/110  soft-tissue]
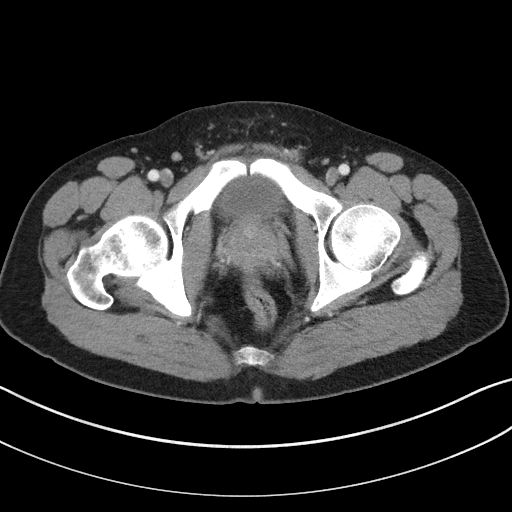
[im 40/110  soft-tissue]
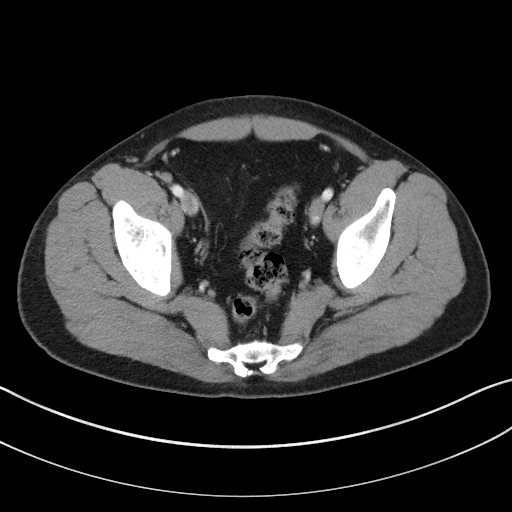
[im 48/110  soft-tissue]
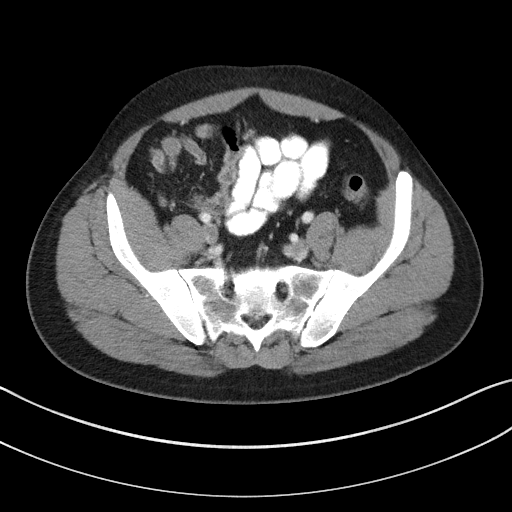
[im 57/110  soft-tissue]
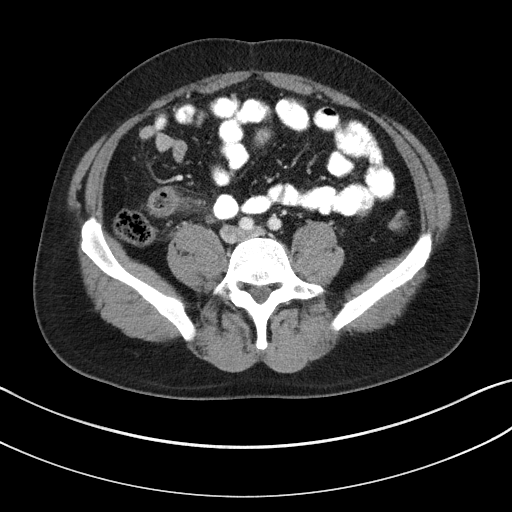
[im 62/110  soft-tissue]
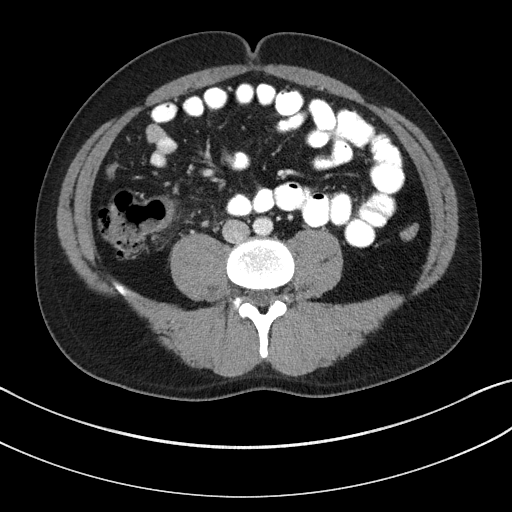
[im 70/110  soft-tissue]
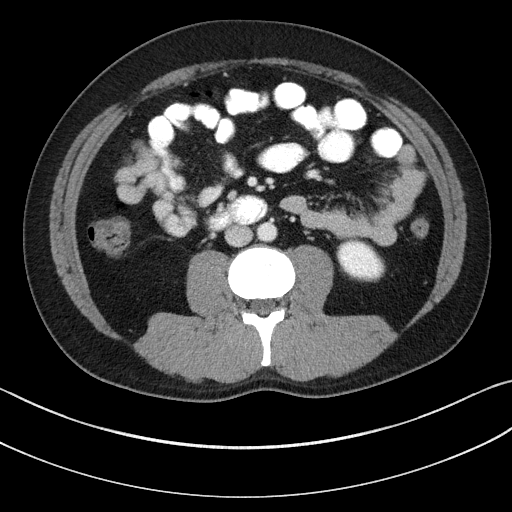
[im 70/110  bone]
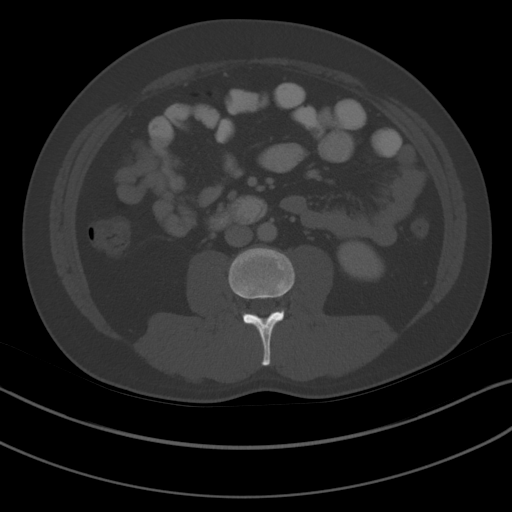
[im 79/110  soft-tissue]
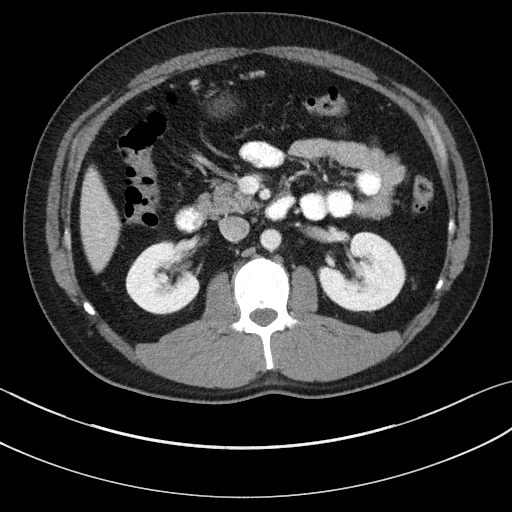
[im 88/110  soft-tissue]
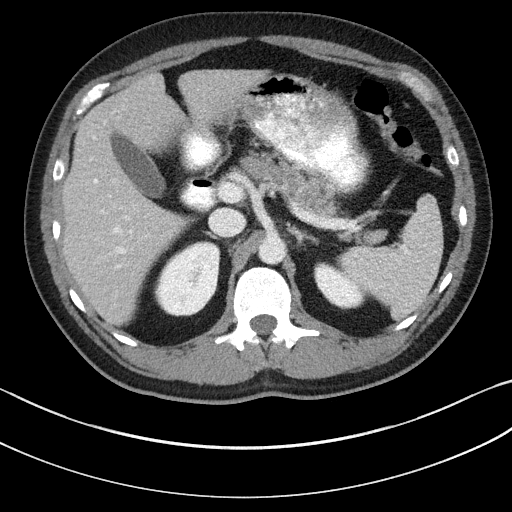
[im 96/110  soft-tissue]
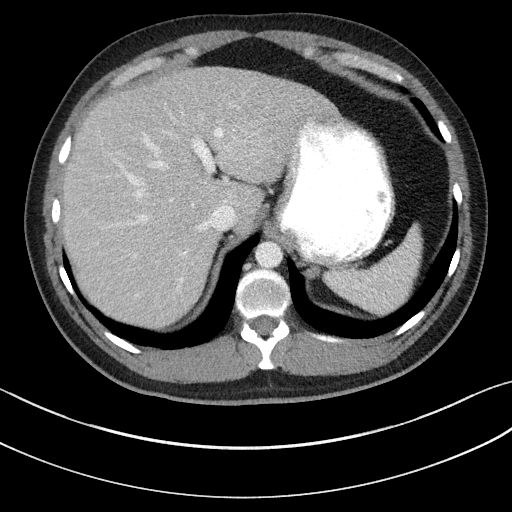
[im 105/110  soft-tissue]
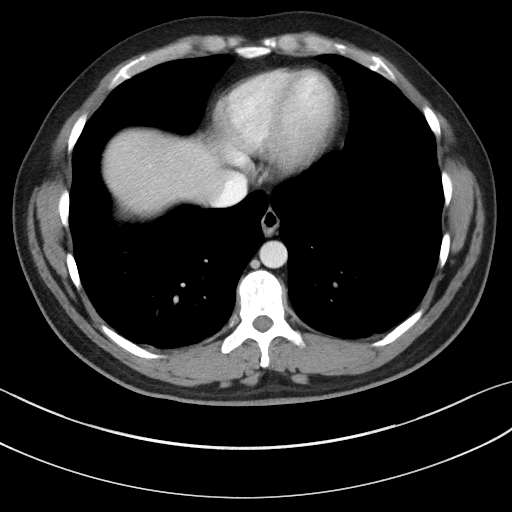

[Series 4: coronal st · coronal · 0.71mm/px · 3 of 95 slices shown]
[im 32/95  soft-tissue]
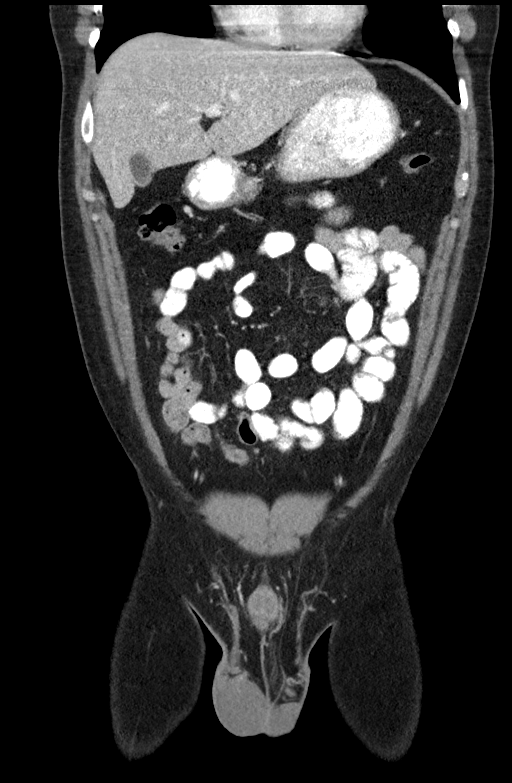
[im 42/95  soft-tissue]
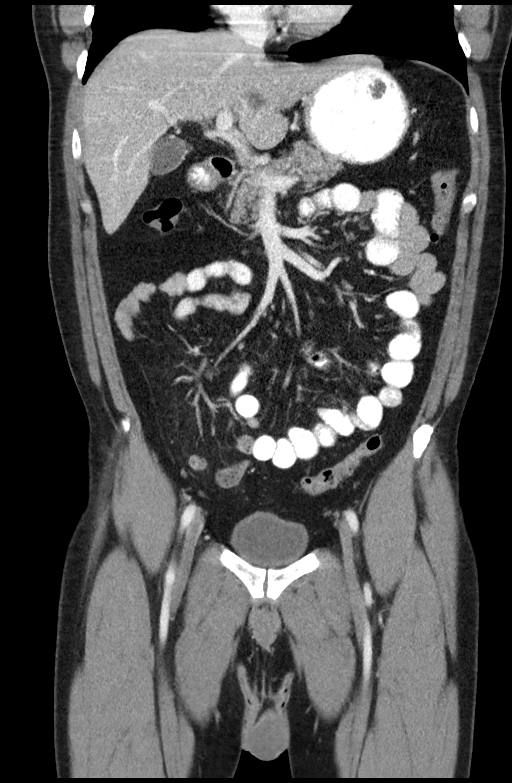
[im 53/95  soft-tissue]
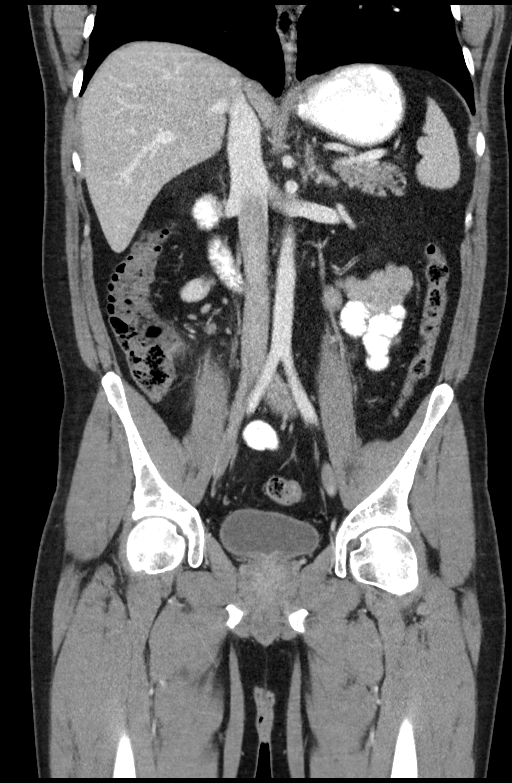

[16 of 46 positions shown; findings below may reference images not displayed]

FINDINGS: Lower chest: 2-3 mm faint nodule over the lateral left lower lobe.

Hepatobiliary: Normal.

Pancreas: Normal.

Spleen: Normal.

Adrenals/Urinary Tract: Adrenal glands are normal. Kidneys normal
size without hydronephrosis or nephrolithiasis. Ureters are normal
in caliber without evidence of stones. Bladder is normal.

Stomach/Bowel: Stomach is normal. There is mild wall thickening of
the terminal ileum with a couple associated small diverticula and
adjacent inflammatory change likely mild acute diverticulitis. No
evidence of perforation or adjacent abscess. The appendix is normal.

Vascular/Lymphatic: Normal.

Reproductive: Normal.

Other: None.

Musculoskeletal: Normal.
IMPRESSION: Findings compatible with mild acute diverticulitis of the terminal
ileum. No perforation or diverticular abscess. Normal appendix.

## 2020-01-12 ENCOUNTER — Ambulatory Visit (INDEPENDENT_AMBULATORY_CARE_PROVIDER_SITE_OTHER): Payer: 59 | Admitting: Nurse Practitioner

## 2020-01-12 ENCOUNTER — Other Ambulatory Visit: Payer: Self-pay

## 2020-01-12 ENCOUNTER — Encounter: Payer: Self-pay | Admitting: Nurse Practitioner

## 2020-01-12 VITALS — BP 132/81 | HR 84 | Temp 98.2°F | Ht 68.0 in | Wt 172.0 lb

## 2020-01-12 DIAGNOSIS — B356 Tinea cruris: Secondary | ICD-10-CM | POA: Diagnosis not present

## 2020-01-12 MED ORDER — MICONAZOLE NITRATE POWD: Freq: Two times a day (BID) | Status: AC

## 2020-01-12 MED ORDER — ITRACONAZOLE 200 MG PO TABS: 1.0000 | ORAL_TABLET | Freq: Every day | ORAL | 0 refills | Status: DC

## 2020-01-12 NOTE — Patient Instructions (Signed)
Tia inguinal Jock Itch  La tia inguinal es una erupcin cutnea que produce picazn en la ingle y la parte superior de los muslos. Es una infeccin cutnea causada por un tipo de germen que vive en lugares oscuros y hmedos (hongo). Por lo general, la erupcin cutnea desaparece en 2 a 3 semanas de tratamiento. Siga estas indicaciones en su casa: Cuidado de la piel  Use cremas, ungentos o polvos para la piel exactamente como se lo haya indicado el mdico.  Use ropa suelta. La ropa no debe rozar contra la zona de la ingle. Los hombres deben usar calzoncillos o ropa interior [place].  Mantenga la zona de la ingle limpia y seca. ? [employer] ropa interior [employer]. ? Cmbiese los trajes de bao mojados tan pronto como pueda. ? Despus del bao, use una toalla aparte para secar la zona de la ingle. Seque la zona suavemente en su totalidad.  Evite baos y duchas calientes. El agua caliente puede empeorar la picazn.  No se rasque la zona. Instrucciones generales  Tome y [employer] medicamentos de venta libre y los recetados solamente como se lo haya indicado el mdico.  No comparta toallas ni ropa con [employer].  Lvese las manos con agua y jabn con frecuencia, especialmente despus de tocarse la zona de la ingle. Use un desinfectante para manos con alcohol si no tiene dispone de [place] y [place]. Comunquese con un mdico si:  La erupcin cutnea: ? Empeora. ? No mejora luego de 2 semanas de tratamiento. ? Se propaga. ? Se vuelve a manifestar una vez finalizado el tratamiento.  Tiene alguno de los siguientes sntomas: ? Grant Ruts. ? Enrojecimiento, hinchazn o dolor alrededor de la erupcin cutnea, nuevos o empeoramiento de los ya existentes. ? Lquido, sangre o pus que salen de la erupcin cutnea. Resumen  La tia inguinal es una erupcin cutnea que produce picazn. Afecta la ingle y la parte superior de los muslos.  Por lo general, la tia inguinal desaparece en  2 a 3 semanas de tratamiento.  Mantenga la zona de la ingle limpia y seca. Esta informacin no tiene Theme park manager el consejo del mdico. Asegrese de hacerle al mdico cualquier pregunta que tenga. Document Revised: 01/29/2018 Document Reviewed: 01/29/2018 Elsevier Patient Education  2020 [employer].

## 2020-01-12 NOTE — Progress Notes (Signed)
Acute Office Visit  Subjective:    Patient ID: Casey Bowers, male    DOB: 1986-07-31, 34 y.o.   MRN: [id number]  CC: Rash on groin  HPI Patient is in today for pruritic, erythematous, maculopapular rash located bilaterally on the medial thigh and scrotum.  He reports the rash first appeared approximately 4 months ago.  He states it appears to be getting worse despite trying Lamisil cream and over-the-counter powder.  He reports the symptoms become worse with increased body heat.  He is currently sexually active with one monogamous partner.  He reports that his wife does not have symptoms. He denies a history of STI.  RASH Duration:  months  Location: groin  Itching: yes Burning: yes Redness: yes Oozing: no Scaling: no Blisters: no Painful: yes Fevers: no Change in detergents/soaps/personal care products: no Recent illness: no Recent travel:no History of same: no Context: worse Alleviating factors: nothing Treatments attempted:powder and OTC anit-fungal Shortness of breath: no  Throat/tongue swelling: no Myalgias/arthralgias: no   Past Medical History:  Diagnosis Date  . Hyperlipidemia     No past surgical history on file.  No family history on file.  Social History   Socioeconomic History  . Marital status: Single    Spouse name: Not on file  . Number of children: Not on file  . Years of education: Not on file  . Highest education level: Not on file  Occupational History  . Not on file  Tobacco Use  . Smoking status: Never Smoker  . Smokeless tobacco: Never Used  Substance and Sexual Activity  . Alcohol use: Yes    Alcohol/week: 0.0 standard drinks  . Drug use: No  . Sexual activity: Yes  Other Topics Concern  . Not on file  Social History Narrative  . Not on file   Social Determinants of Health   Financial Resource Strain:   . Difficulty of Paying Living Expenses: Not on file  Food Insecurity:   . Worried About Programme researcher, broadcasting/film/video in the  Last Year: Not on file  . Ran Out of Food in the Last Year: Not on file  Transportation Needs:   . Lack of Transportation (Medical): Not on file  . Lack of Transportation (Non-Medical): Not on file  Physical Activity:   . Days of Exercise per Week: Not on file  . Minutes of Exercise per Session: Not on file  Stress:   . Feeling of Stress : Not on file  Social Connections:   . Frequency of Communication with Friends and Family: Not on file  . Frequency of Social Gatherings with Friends and Family: Not on file  . Attends Religious Services: Not on file  . Active Member of Clubs or Organizations: Not on file  . Attends Banker Meetings: Not on file  . Marital Status: Not on file  Intimate Partner Violence:   . Fear of Current or Ex-Partner: Not on file  . Emotionally Abused: Not on file  . Physically Abused: Not on file  . Sexually Abused: Not on file    No outpatient medications prior to visit.   No facility-administered medications prior to visit.    No Known Allergies  Review of Systems  Constitutional: Negative for chills and fever.  Genitourinary: Negative for difficulty urinating, discharge, dysuria, flank pain, frequency, genital sores, hematuria, penile pain, scrotal swelling, testicular pain and urgency.  Skin: Positive for rash.       Objective:    Physical Exam Vitals and  nursing note reviewed.  Constitutional:      Appearance: Normal appearance. He is normal weight.  Genitourinary:    Pubic Area: Rash present.     Comments: Intact, erythematous plaques located bilaterally on the medial surface of both thighs, intreginous folds of the groin, and scrotum.  Skin:    General: Skin is warm and dry.     Findings: Rash present.  Neurological:     Mental Status: He is alert.  Psychiatric:        Mood and Affect: Mood normal.        Behavior: Behavior normal.     BP 132/81   Pulse 84   Temp 98.2 F (36.8 C) (Oral)   Ht 5\' 8"  (1.727 m)   Wt  172 lb (78 kg)   SpO2 98%   BMI 26.15 kg/m  Wt Readings from Last 3 Encounters:  01/12/20 172 lb (78 kg)  04/06/18 158 lb (71.7 kg)  02/11/18 163 lb (73.9 kg)    Health Maintenance Due  Topic Date Due  . INFLUENZA VACCINE  07/23/2019    There are no preventive care reminders to display for this patient.   No results found for: TSH Lab Results  Component Value Date   WBC 15.3 (H) 02/11/2018   HGB 16.1 02/11/2018   HCT 45.4 02/11/2018   MCV 90.8 02/11/2018   PLT 251 02/11/2018   Lab Results  Component Value Date   NA 142 04/06/2018   K 4.1 04/06/2018   CO2 31 04/06/2018   GLUCOSE 109 (H) 04/06/2018   BUN 11 04/06/2018   CREATININE 0.87 04/06/2018   BILITOT 0.4 04/06/2018   ALKPHOS 84 02/12/2016   AST 19 04/06/2018   ALT 24 04/06/2018   PROT 6.9 04/06/2018   ALBUMIN 4.4 02/12/2016   CALCIUM 9.5 04/06/2018   Lab Results  Component Value Date   CHOL 182 04/06/2018   Lab Results  Component Value Date   HDL 38 (L) 04/06/2018   Lab Results  Component Value Date   LDLCALC 110 (H) 04/06/2018   Lab Results  Component Value Date   TRIG 222 (H) 04/06/2018   Lab Results  Component Value Date   CHOLHDL 4.8 04/06/2018   No results found for: HGBA1C     Assessment & Plan:   1. Tinea cruris Oral and topical prescription treatments provided due to larger surface area involvement.  Patient educated on topical treatment application and provided with information on diagnosis and instructions to keep the area dry and clean.  Patient instructed to return to office if symptoms do not resolve after completing oral medication. - Itraconazole 200 MG TABS; Take 1 tablet by mouth daily.  Dispense: 7 tablet; Refill: 0 - miconazole nitrate (MICATIN) topical powder  Return if symptoms worsen or fail to improve.   Orma Render, NP

## 2020-06-12 ENCOUNTER — Encounter: Payer: Self-pay | Admitting: Nurse Practitioner

## 2020-06-12 ENCOUNTER — Ambulatory Visit (INDEPENDENT_AMBULATORY_CARE_PROVIDER_SITE_OTHER): Payer: 59 | Admitting: Nurse Practitioner

## 2020-06-12 VITALS — BP 122/82 | HR 76 | Temp 98.1°F | Ht 68.0 in | Wt 163.4 lb

## 2020-06-12 DIAGNOSIS — B369 Superficial mycosis, unspecified: Secondary | ICD-10-CM

## 2020-06-12 DIAGNOSIS — L247 Irritant contact dermatitis due to plants, except food: Secondary | ICD-10-CM | POA: Diagnosis not present

## 2020-06-12 MED ORDER — FLUCONAZOLE 150 MG PO TABS
ORAL_TABLET | ORAL | 1 refills | Status: DC
Start: 2020-06-12 — End: 2020-07-03

## 2020-06-12 MED ORDER — DEXAMETHASONE SODIUM PHOSPHATE 10 MG/ML IJ SOLN
10.0000 mg | Freq: Once | INTRAMUSCULAR | Status: AC
  Administered 2020-06-12: 10 mg via INTRAMUSCULAR

## 2020-06-12 MED ORDER — BETAMETHASONE DIPROPIONATE 0.05 % EX CREA: TOPICAL_CREAM | Freq: Two times a day (BID) | CUTANEOUS | 1 refills | Status: DC

## 2020-06-12 NOTE — Progress Notes (Signed)
Acute Office Visit  Subjective:    Patient ID: Casey Bowers, male    DOB: February 26, 1986, 34 y.o.   MRN: [id number]  Chief Complaint  Patient presents with   Rash    left arm, onset 1 wk, large area lateral anticubital area, itching, painful, has been using Benadryl and Calomine creams with minimal relief, new spots are stating to break on bilateral arms    HPI Patient is in today for contact dermatitis from exposure to an unknown plant- presumably poison ivy- while working in his yard last week. He has tried calamine lotion and benadryl cream to the area, but reports that it is still intensely pruritic and swollen. He also tried dabbing vinegar on the area yesterday and reports that this helped with the erythema and inflammation.   He also reports fungal infection around his groin is returning with the heat. He used itraconazole in January, which got rid of the infection, but he reports the prescription was very expensive and wanted to know if there was a less expensive alternative.   Past Medical History:  Diagnosis Date   Hyperlipidemia     History reviewed. No pertinent surgical history.  History reviewed. No pertinent family history.  Social History   Socioeconomic History   Marital status: Single    Spouse name: Not on file   Number of children: Not on file   Years of education: Not on file   Highest education level: Not on file  Occupational History   Not on file  Tobacco Use   Smoking status: Never Smoker   Smokeless tobacco: Never Used  Substance and Sexual Activity   Alcohol use: Yes    Alcohol/week: 0.0 standard drinks   Drug use: No   Sexual activity: Yes  Other Topics Concern   Not on file  Social History Narrative   Not on file   Social Determinants of Health   Financial Resource Strain:    Difficulty of Paying Living Expenses:   Food Insecurity:    Worried About Programme researcher, broadcasting/film/video in the Last Year:    Barista in the  Last Year:   Transportation Needs:    Freight forwarder (Medical):    Lack of Transportation (Non-Medical):   Physical Activity:    Days of Exercise per Week:    Minutes of Exercise per Session:   Stress:    Feeling of Stress :   Social Connections:    Frequency of Communication with Friends and Family:    Frequency of Social Gatherings with Friends and Family:    Attends Religious Services:    Active Member of Clubs or Organizations:    Attends Engineer, structural:    Marital Status:   Intimate Partner Violence:    Fear of Current or Ex-Partner:    Emotionally Abused:    Physically Abused:    Sexually Abused:     Outpatient Medications Prior to Visit  Medication Sig Dispense Refill   Itraconazole 200 MG TABS Take 1 tablet by mouth daily. (Patient not taking: Reported on 06/12/2020) 7 tablet 0   No facility-administered medications prior to visit.    No Known Allergies  Review of Systems  Constitutional: Negative for activity change, chills, fatigue and fever.  HENT: Negative for congestion, rhinorrhea, sore throat, trouble swallowing and voice change.   Eyes: Negative for pain, redness and visual disturbance.  Respiratory: Negative for cough, chest tightness and shortness of breath.   Musculoskeletal: Negative for arthralgias,  joint swelling and myalgias.  Skin: Positive for color change and rash.  Neurological: Negative for dizziness, syncope, weakness and headaches.  Psychiatric/Behavioral: Negative for confusion.       Objective:    Physical Exam Vitals and nursing note reviewed.  Constitutional:      Appearance: Normal appearance. He is normal weight.  HENT:     Head: Normocephalic.     Nose: Nose normal.     Mouth/Throat:     Mouth: Mucous membranes are moist.     Pharynx: Oropharynx is clear.  Eyes:     Pupils: Pupils are equal, round, and reactive to light.  Cardiovascular:     Rate and Rhythm: Normal rate and regular  rhythm.     Pulses: Normal pulses.     Heart sounds: Normal heart sounds.  Pulmonary:     Effort: Pulmonary effort is normal.     Breath sounds: Normal breath sounds.  Abdominal:     General: Abdomen is flat.     Palpations: Abdomen is soft.  Musculoskeletal:        General: Normal range of motion.     Cervical back: Normal range of motion.  Skin:    General: Skin is warm and dry.     Capillary Refill: Capillary refill takes less than 2 seconds.     Findings: Erythema and rash present. Rash is crusting and vesicular.          Comments: Scattered areas of vesicular rash noted bilaterally on the arms in addition to large lesion on the left antecubital region.   Neurological:     General: No focal deficit present.     Mental Status: He is alert and oriented to person, place, and time.  Psychiatric:        Mood and Affect: Mood normal.        Behavior: Behavior normal.        Thought Content: Thought content normal.        Judgment: Judgment normal.     BP 122/82    Pulse 76    Temp 98.1 F (36.7 C) (Oral)    Ht 5\' 8"  (1.727 m)    Wt 163 lb 6.4 oz (74.1 kg)    SpO2 95%    BMI 24.84 kg/m  Wt Readings from Last 3 Encounters:  06/12/20 163 lb 6.4 oz (74.1 kg)  01/12/20 172 lb (78 kg)  04/06/18 158 lb (71.7 kg)    There are no preventive care reminders to display for this patient.  There are no preventive care reminders to display for this patient.   No results found for: TSH Lab Results  Component Value Date   WBC 15.3 (H) 02/11/2018   HGB 16.1 02/11/2018   HCT 45.4 02/11/2018   MCV 90.8 02/11/2018   PLT 251 02/11/2018   Lab Results  Component Value Date   NA 142 04/06/2018   K 4.1 04/06/2018   CO2 31 04/06/2018   GLUCOSE 109 (H) 04/06/2018   BUN 11 04/06/2018   CREATININE 0.87 04/06/2018   BILITOT 0.4 04/06/2018   ALKPHOS 84 02/12/2016   AST 19 04/06/2018   ALT 24 04/06/2018   PROT 6.9 04/06/2018   ALBUMIN 4.4 02/12/2016   CALCIUM 9.5 04/06/2018   Lab  Results  Component Value Date   CHOL 182 04/06/2018   Lab Results  Component Value Date   HDL 38 (L) 04/06/2018   Lab Results  Component Value Date   LDLCALC 110 (H)  04/06/2018   Lab Results  Component Value Date   TRIG 222 (H) 04/06/2018   Lab Results  Component Value Date   CHOLHDL 4.8 04/06/2018   No results found for: HGBA1C     Assessment & Plan:   1. Contact dermatitis and eczema due to plant Symptoms and presentation consistent with contact dermatitis due to plant. Areas limited to the arms bilaterally with no respiratory compromise at this time. No indications of infection are present at this time, however, the largest area is weeping crusted over which will make infection possible. Discussed the importance of keeping the area clean and dry and to notify the office if any signs of infection present.  PLAN: - Dexamethasone injection today - Betamethasone cream as needed twice a day for itching - May continue to use calamine lotion to the area for relief of symptoms - Keep the area clean and dry - Monitor for signs of infection and notify the office if this occurs.    - betamethasone dipropionate 0.05 % cream; Apply topically 2 (two) times daily. To affected area(s) as needed for rash and itching  Dispense: 45 g; Refill: 1 - dexamethasone (DECADRON) injection 10 mg  2. Fungal infection of skin Repeat of fungal infection to the groin. He was successfully treated with itraconazole approximately 6 months agio, but the medication was expensive. We will trial a different medication that is less expensive to see if this will help with symptoms. Discussed the importance of keeping the groin cool and drying completely after shower, changing out of moist clothes quickly, and utilizing over the counter fungal powders to help prevent symptom recurrence.  PLAN: - Fluconazole once today and repeat in three days.  - Keep area cool and dry - Avoid remaining in moist clothing - Wash  and dry area completely after showering, swimming, or activities that cause perspiration and moisture to the area.  - May utilize over the counter powder, with or without antifungal properties, to the area after bathing to help with moisture. - Follow-up if symptoms worsen or fail to improve  - fluconazole (DIFLUCAN) 150 MG tablet; Take one tab (150 mg) by mouth today, then take one tab (150 mg) by mouth three days later.  Dispense: 2 tablet; Refill: 1   Orma Render, NP

## 2020-06-12 NOTE — Patient Instructions (Signed)
The steroid injection given today will continue to work for several days.  You may use the prescription cream sent to the pharmacy to help with itching.  Keep the area clean and dry. If it looks like it is becoming infected, send me a message through [place] or call the office and let me know.   Keep the area in your groin clean and dry. Dry off well after showering and avoid wearing moist, sweaty clothes for any length of time to prevent growth of the fungus.  The medication at the pharmacy should help with this fungal infection. You may also use a powder to the area for fungus- these can be found over the counter at the pharmacy.    Poison Ivy Dermatitis Poison ivy dermatitis is redness and soreness of the skin caused by chemicals in the leaves of the poison ivy plant. You may have very bad itching, swelling, a rash, and blisters. What are the causes?  Touching a poison ivy plant.  Touching something that has the chemical on it. This may include animals or objects that have come in contact with the plant. What increases the risk?  Going outdoors often in wooded or [place] areas.  Going outdoors without wearing protective clothing, such as closed shoes, long pants, and a long-sleeved shirt. What are the signs or symptoms?   Skin redness.  Very bad itching.  A rash that often includes bumps and blisters. ? The rash usually appears 48 hours after exposure, if you have been exposed before. ? If this is the first time you have been exposed, the rash may not appear until a week after exposure.  Swelling. This may occur if the reaction is very bad. Symptoms usually last for 1-2 weeks. The first time you develop this condition, symptoms may last 3-4 weeks. How is this treated? This condition may be treated with:  Hydrocortisone cream or calamine lotion to relieve itching.  Oatmeal baths to soothe the skin.  Medicines, such as over-the-counter antihistamine tablets.  Oral steroid  medicine for more severe reactions. Follow these instructions at home: Medicines  Take or apply over-the-counter and prescription medicines only as told by your doctor.  Use hydrocortisone cream or calamine lotion as needed to help with itching. General instructions  Do not scratch or rub your skin.  Put a cold, wet cloth (cold compress) on the affected areas or take baths in cool water. This will help with itching.  Avoid hot baths and showers.  Take oatmeal baths as needed. Use colloidal oatmeal. You can get this at a pharmacy or grocery store. Follow the instructions on the package.  While you have the rash, wash your clothes right after you wear them.  Keep all follow-up visits as told by your health care provider. This is important. How is this prevented?   Know what poison ivy looks like, so you can avoid it. ? This plant has three leaves with flowering branches on a single stem. ? The leaves are glossy. ? The leaves have uneven edges that come to a point at the front.  If you touch poison ivy, wash your skin with soap and water right away. Be sure to wash under your fingernails.  When hiking or camping, wear long pants, a long-sleeved shirt, tall socks, and hiking boots. You can also use a lotion on your skin that helps to prevent contact with poison ivy.  If you think that your clothes or outdoor gear came in contact with poison ivy, rinse  them off with a garden hose before you bring them inside your house.  When doing yard work or gardening, wear gloves, long sleeves, long pants, and boots. Wash your garden tools and gloves if they come in contact with poison ivy.  If you think that your pet has come into contact with poison ivy, wash him or her with pet shampoo and water. Make sure to wear gloves while washing your pet. Contact a doctor if:  You have open sores in the rash area.  You have more redness, swelling, or pain in the rash area.  You have redness that  spreads beyond the rash area.  You have fluid, blood, or pus coming from the rash area.  You have a fever.  You have a rash over a large area of your body.  You have a rash on your eyes, mouth, or genitals.  Your rash does not get better after a few weeks. Get help right away if:  Your face swells or your eyes swell shut.  You have trouble breathing.  You have trouble swallowing. These symptoms may be an emergency. Do not wait to see if the symptoms will go away. Get medical help right away. Call your local emergency services (911 in the [place] Do not drive yourself to the hospital. Summary  Poison ivy dermatitis is redness and soreness of the skin caused by chemicals in the leaves of the poison ivy plant.  You may have skin redness, very bad itching, swelling, and a rash.  Do not scratch or rub your skin.  Take or apply over-the-counter and prescription medicines only as told by your doctor. This information is not intended to replace advice given to you by your health care provider. Make sure you discuss any questions you have with your health care provider. Document Revised: 04/01/2019 Document Reviewed: 12/03/2018 Elsevier Patient Education  2020 [employer].

## 2020-07-03 ENCOUNTER — Ambulatory Visit (INDEPENDENT_AMBULATORY_CARE_PROVIDER_SITE_OTHER): Payer: 59 | Admitting: Family Medicine

## 2020-07-03 ENCOUNTER — Encounter: Payer: Self-pay | Admitting: Family Medicine

## 2020-07-03 ENCOUNTER — Ambulatory Visit (INDEPENDENT_AMBULATORY_CARE_PROVIDER_SITE_OTHER): Payer: 59

## 2020-07-03 VITALS — BP 124/85 | HR 68 | Ht 68.11 in | Wt 164.9 lb

## 2020-07-03 DIAGNOSIS — M79645 Pain in left finger(s): Secondary | ICD-10-CM

## 2020-07-03 MED ORDER — MELOXICAM 15 MG PO TABS: 15.0000 mg | ORAL_TABLET | Freq: Every day | ORAL | 0 refills | Status: DC

## 2020-07-03 NOTE — Patient Instructions (Signed)
Have xray completed.  Try meloxicam for swelling and stiffness.  I have sent this over to your pharmacy.

## 2020-07-03 NOTE — Assessment & Plan Note (Signed)
Unclear cause.  xrays ordered but likely more soft tissue injury.  Will assess for any radiopaque foreign body that may be causing symptoms.   Start meloxicam as needed and recommend icing.

## 2020-07-03 NOTE — Progress Notes (Signed)
Casey Bowers - 34 y.o. male MRN [id number]  Date of birth: 15-Feb-1986  Subjective Chief Complaint  Patient presents with  . Finger Injury    HPI Casey Bowers is a 34 y.o. male here today with finger pain.  Pain located in middle digit of L hand.  He does not recall any injury, states he woke up one morning and it was swollen and stiff.  There is no associated redness or warmth. He has not tried anything to help with this.   ROS:  A comprehensive ROS was completed and negative except as noted per HPI  No Known Allergies  Past Medical History:  Diagnosis Date  . Hyperlipidemia     History reviewed. No pertinent surgical history.  Social History   Socioeconomic History  . Marital status: Single    Spouse name: Not on file  . Number of children: Not on file  . Years of education: Not on file  . Highest education level: Not on file  Occupational History  . Not on file  Tobacco Use  . Smoking status: Never Smoker  . Smokeless tobacco: Never Used  Substance and Sexual Activity  . Alcohol use: Yes    Alcohol/week: 0.0 standard drinks  . Drug use: No  . Sexual activity: Yes  Other Topics Concern  . Not on file  Social History Narrative  . Not on file   Social Determinants of Health   Financial Resource Strain:   . Difficulty of Paying Living Expenses:   Food Insecurity:   . Worried About Programme researcher, broadcasting/film/video in the Last Year:   . Barista in the Last Year:   Transportation Needs:   . Freight forwarder (Medical):   Marland Kitchen Lack of Transportation (Non-Medical):   Physical Activity:   . Days of Exercise per Week:   . Minutes of Exercise per Session:   Stress:   . Feeling of Stress :   Social Connections:   . Frequency of Communication with Friends and Family:   . Frequency of Social Gatherings with Friends and Family:   . Attends Religious Services:   . Active Member of Clubs or Organizations:   . Attends Banker Meetings:    Marland Kitchen Marital Status:     History reviewed. No pertinent family history.  Health Maintenance  Topic Date Due  . Hepatitis C Screening  06/12/2021 (Originally [birth year])  . INFLUENZA VACCINE  07/22/2020  . TETANUS/TDAP  02/11/2026  . COVID-19 Vaccine  Completed  . HIV Screening  Completed     ----------------------------------------------------------------------------------------------------------------------------------------------------------------------------------------------------------------- Physical Exam BP 124/85 (BP Location: Left Arm, Patient Position: Sitting, Cuff Size: Normal)   Pulse 68   Ht 5' 8.11" (1.73 m)   Wt 164 lb 14.4 oz (74.8 kg)   SpO2 99%   BMI 24.99 kg/m   Physical Exam Constitutional:      Appearance: Normal appearance.  Musculoskeletal:     Comments: Swelling and stiffness of middle digit of L hand.  Mild ttp around PIP joint.    Neurological:     General: No focal deficit present.     Mental Status: He is alert.  Psychiatric:        Mood and Affect: Mood normal.        Behavior: Behavior normal.     ------------------------------------------------------------------------------------------------------------------------------------------------------------------------------------------------------------------- Assessment and Plan  Pain of finger of left hand Unclear cause.  xrays ordered but likely more soft tissue injury.  Will assess for any radiopaque foreign body that  may be causing symptoms.   Start meloxicam as needed and recommend icing.    Meds ordered this encounter  Medications  . meloxicam (MOBIC) 15 MG tablet    Sig: Take 1 tablet (15 mg total) by mouth daily.    Dispense:  30 tablet    Refill:  0    No follow-ups on file.    This visit occurred during the SARS-CoV-2 public health emergency.  Safety protocols were in place, including screening questions prior to the visit, additional usage of staff PPE, and extensive  cleaning of exam room while observing appropriate contact time as indicated for disinfecting solutions.

## 2020-07-25 ENCOUNTER — Other Ambulatory Visit: Payer: Self-pay | Admitting: Family Medicine

## 2020-08-08 ENCOUNTER — Other Ambulatory Visit: Payer: Self-pay

## 2020-08-08 ENCOUNTER — Ambulatory Visit (INDEPENDENT_AMBULATORY_CARE_PROVIDER_SITE_OTHER): Payer: 59 | Admitting: Family Medicine

## 2020-08-08 ENCOUNTER — Encounter: Payer: Self-pay | Admitting: Family Medicine

## 2020-08-08 VITALS — BP 106/70 | HR 56 | Temp 98.2°F | Ht 67.75 in | Wt 162.0 lb

## 2020-08-08 DIAGNOSIS — E785 Hyperlipidemia, unspecified: Secondary | ICD-10-CM

## 2020-08-08 DIAGNOSIS — Z0000 Encounter for general adult medical examination without abnormal findings: Secondary | ICD-10-CM | POA: Diagnosis not present

## 2020-08-08 LAB — COMPLETE METABOLIC PANEL WITH GFR
AG Ratio: 2 (calc) (ref 1.0–2.5)
ALT: 20 U/L (ref 9–46)
AST: 17 U/L (ref 10–40)
Albumin: 4.4 g/dL (ref 3.6–5.1)
Alkaline phosphatase (APISO): 89 U/L (ref 36–130)
BUN: 13 mg/dL (ref 7–25)
CO2: 26 mmol/L (ref 20–32)
Calcium: 9 mg/dL (ref 8.6–10.3)
Chloride: 106 mmol/L (ref 98–110)
Creat: 0.76 mg/dL (ref 0.60–1.35)
GFR, Est African American: 139 mL/min/{1.73_m2} (ref >=60)
GFR, Est Non African American: 120 mL/min/{1.73_m2} (ref >=60)
Globulin: 2.2 g/dL (calc) (ref 1.9–3.7)
Glucose, Bld: 113 mg/dL — ABNORMAL HIGH (ref 65–99)
Potassium: 4.3 mmol/L (ref 3.5–5.3)
Sodium: 139 mmol/L (ref 135–146)
Total Bilirubin: 0.5 mg/dL (ref 0.2–1.2)
Total Protein: 6.6 g/dL (ref 6.1–8.1)

## 2020-08-08 LAB — LIPID PANEL
Cholesterol: 175 mg/dL (ref <200)
HDL: 35 mg/dL — ABNORMAL LOW (ref >=40)
LDL Cholesterol (Calc): 108 mg/dL (calc) — ABNORMAL HIGH
Non-HDL Cholesterol (Calc): 140 mg/dL (calc) — ABNORMAL HIGH (ref <130)
Total CHOL/HDL Ratio: 5 (calc) — ABNORMAL HIGH (ref <5.0)
Triglycerides: 206 mg/dL — ABNORMAL HIGH (ref <150)

## 2020-08-08 LAB — CBC
HCT: 43.3 % (ref 38.5–50.0)
Hemoglobin: 14.9 g/dL (ref 13.2–17.1)
MCH: 31.4 pg (ref 27.0–33.0)
MCHC: 34.4 g/dL (ref 32.0–36.0)
MCV: 91.2 fL (ref 80.0–100.0)
MPV: 11.3 fL (ref 7.5–12.5)
Platelets: 240 10*3/uL (ref 140–400)
RBC: 4.75 10*6/uL (ref 4.20–5.80)
RDW: 12.1 % (ref 11.0–15.0)
WBC: 6.2 10*3/uL (ref 3.8–10.8)

## 2020-08-08 NOTE — Assessment & Plan Note (Signed)
Well adult Orders Placed This Encounter  Procedures  . COMPLETE METABOLIC PANEL WITH GFR  . CBC  . Lipid Profile  Screening: UTD Immunizations: UTD Anticipatory guidance/Risk factor reduction:  Recommendations given for healthy diet and regular exercise.  Additional recommendations per AVS.   Recommend seeing Dr. Karie Schwalbe for ongoing finger swelling.

## 2020-08-08 NOTE — Progress Notes (Signed)
Casey Bowers - 34 y.o. male MRN [id number]  Date of birth: 1986/11/10  Subjective Chief Complaint  Patient presents with  . Annual Exam    HPI Casey Bowers is a 34 y.o. male here today for annual exam.  He has history of elevated cholesterol.  He denies new changes or health concerns.  Still having some swelling and stiffness in middle finger of L hand.  Meloxicam helped some with this but never fully resolved.   He admits that diet could be improved.  He does not exercise regularly.    He is a non-smoker and consumes EtOH rarely.    Review of Systems  Constitutional: Negative for chills, fever, malaise/fatigue and weight loss.  HENT: Negative for congestion, ear pain and sore throat.   Eyes: Negative for blurred vision, double vision and pain.  Respiratory: Negative for cough and shortness of breath.   Cardiovascular: Negative for chest pain and palpitations.  Gastrointestinal: Negative for abdominal pain, blood in stool, constipation, heartburn and nausea.  Genitourinary: Negative for dysuria and urgency.  Musculoskeletal: Negative for joint pain and myalgias.  Neurological: Negative for dizziness and headaches.  Endo/Heme/Allergies: Does not bruise/bleed easily.  Psychiatric/Behavioral: Negative for depression. The patient is not nervous/anxious and does not have insomnia.     No Known Allergies  Past Medical History:  Diagnosis Date  . Hyperlipidemia     History reviewed. No pertinent surgical history.  Social History   Socioeconomic History  . Marital status: Single    Spouse name: Not on file  . Number of children: Not on file  . Years of education: Not on file  . Highest education level: Not on file  Occupational History  . Not on file  Tobacco Use  . Smoking status: Never Smoker  . Smokeless tobacco: Never Used  Substance and Sexual Activity  . Alcohol use: Yes    Alcohol/week: 0.0 standard drinks  . Drug use: No  . Sexual activity:  Yes  Other Topics Concern  . Not on file  Social History Narrative  . Not on file   Social Determinants of Health   Financial Resource Strain:   . Difficulty of Paying Living Expenses:   Food Insecurity:   . Worried About Programme researcher, broadcasting/film/video in the Last Year:   . Barista in the Last Year:   Transportation Needs:   . Freight forwarder (Medical):   Marland Kitchen Lack of Transportation (Non-Medical):   Physical Activity:   . Days of Exercise per Week:   . Minutes of Exercise per Session:   Stress:   . Feeling of Stress :   Social Connections:   . Frequency of Communication with Friends and Family:   . Frequency of Social Gatherings with Friends and Family:   . Attends Religious Services:   . Active Member of Clubs or Organizations:   . Attends Banker Meetings:   Marland Kitchen Marital Status:     History reviewed. No pertinent family history.  Health Maintenance  Topic Date Due  . INFLUENZA VACCINE  07/22/2020  . Hepatitis C Screening  06/12/2021 (Originally November 13, 1986)  . TETANUS/TDAP  02/11/2026  . COVID-19 Vaccine  Completed  . HIV Screening  Completed     ----------------------------------------------------------------------------------------------------------------------------------------------------------------------------------------------------------------- Physical Exam BP 106/70   Pulse (!) 56   Temp 98.2 F (36.8 C) (Oral)   Ht 5' 7.75" (1.721 m)   Wt 162 lb (73.5 kg)   SpO2 97% Comment: on RA  BMI 24.81 kg/m  Physical Exam Constitutional:      General: He is not in acute distress. HENT:     Head: Normocephalic and atraumatic.     Right Ear: External ear normal.     Left Ear: External ear normal.  Eyes:     General: No scleral icterus. Neck:     Thyroid: No thyromegaly.  Cardiovascular:     Rate and Rhythm: Normal rate and regular rhythm.     Heart sounds: Normal heart sounds.  Pulmonary:     Effort: Pulmonary effort is normal.      Breath sounds: Normal breath sounds.  Abdominal:     General: Bowel sounds are normal. There is no distension.     Palpations: Abdomen is soft.     Tenderness: There is no abdominal tenderness. There is no guarding.  Musculoskeletal:     Cervical back: Normal range of motion.     Comments: Swelling middle finger PIP L hand  Lymphadenopathy:     Cervical: No cervical adenopathy.  Skin:    General: Skin is warm and dry.     Findings: No rash.  Neurological:     General: No focal deficit present.     Mental Status: He is alert and oriented to person, place, and time.     Cranial Nerves: No cranial nerve deficit.     Motor: No abnormal muscle tone.  Psychiatric:        Mood and Affect: Mood normal.        Behavior: Behavior normal.     ------------------------------------------------------------------------------------------------------------------------------------------------------------------------------------------------------------------- Assessment and Plan  Well adult exam Well adult Orders Placed This Encounter  Procedures  . COMPLETE METABOLIC PANEL WITH GFR  . CBC  . Lipid Profile  Screening: UTD Immunizations: UTD Anticipatory guidance/Risk factor reduction:  Recommendations given for healthy diet and regular exercise.  Additional recommendations per AVS.   Recommend seeing Dr. Karie Schwalbe for ongoing finger swelling.    No orders of the defined types were placed in this encounter.   No follow-ups on file.    This visit occurred during the SARS-CoV-2 public health emergency.  Safety protocols were in place, including screening questions prior to the visit, additional usage of staff PPE, and extensive cleaning of exam room while observing appropriate contact time as indicated for disinfecting solutions.

## 2020-08-08 NOTE — Patient Instructions (Signed)
Preventive Care 19-34 Years Old, Male Preventive care refers to lifestyle choices and visits with your health care provider that can promote health and wellness. This includes:  A yearly physical exam. This is also called an annual well check.  Regular dental and eye exams.  Immunizations.  Screening for certain conditions.  Healthy lifestyle choices, such as eating a healthy diet, getting regular exercise, not using drugs or products that contain nicotine and tobacco, and limiting alcohol use. What can I expect for my preventive care visit? Physical exam Your health care provider will check:  Height and weight. These may be used to calculate body mass index (BMI), which is a measurement that tells if you are at a healthy weight.  Heart rate and blood pressure.  Your skin for abnormal spots. Counseling Your health care provider may ask you questions about:  Alcohol, tobacco, and drug use.  Emotional well-being.  Home and relationship well-being.  Sexual activity.  Eating habits.  Work and work Statistician. What immunizations do I need?  Influenza (flu) vaccine  This is recommended every year. Tetanus, diphtheria, and pertussis (Tdap) vaccine  You may need a Td booster every 10 years. Varicella (chickenpox) vaccine  You may need this vaccine if you have not already been vaccinated. Human papillomavirus (HPV) vaccine  If recommended by your health care provider, you may need three doses over 6 months. Measles, mumps, and rubella (MMR) vaccine  You may need at least one dose of MMR. You may also need a second dose. Meningococcal conjugate (MenACWY) vaccine  One dose is recommended if you are 45-76 years old and a Market researcher living in a residence hall, or if you have one of several medical conditions. You may also need additional booster doses. Pneumococcal conjugate (PCV13) vaccine  You may need this if you have certain conditions and were not  previously vaccinated. Pneumococcal polysaccharide (PPSV23) vaccine  You may need one or two doses if you smoke cigarettes or if you have certain conditions. Hepatitis A vaccine  You may need this if you have certain conditions or if you travel or work in places where you may be exposed to hepatitis A. Hepatitis B vaccine  You may need this if you have certain conditions or if you travel or work in places where you may be exposed to hepatitis B. Haemophilus influenzae type b (Hib) vaccine  You may need this if you have certain risk factors. You may receive vaccines as individual doses or as more than one vaccine together in one shot (combination vaccines). Talk with your health care provider about the risks and benefits of combination vaccines. What tests do I need? Blood tests  Lipid and cholesterol levels. These may be checked every 5 years starting at age 17.  Hepatitis C test.  Hepatitis B test. Screening   Diabetes screening. This is done by checking your blood sugar (glucose) after you have not eaten for a while (fasting).  Sexually transmitted disease (STD) testing. Talk with your health care provider about your test results, treatment options, and if necessary, the need for more tests. Follow these instructions at home: Eating and drinking   Eat a diet that includes fresh fruits and vegetables, whole grains, lean protein, and low-fat dairy products.  Take vitamin and mineral supplements as recommended by your health care provider.  Do not drink alcohol if your health care provider tells you not to drink.  If you drink alcohol: ? Limit how much you have to 0-2  drinks a day. ? Be aware of how much alcohol is in your drink. In the [place] one drink equals one 12 oz bottle of beer (355 mL), one 5 oz glass of wine (148 mL), or one 1 oz glass of hard liquor (44 mL). Lifestyle  Take daily care of your teeth and gums.  Stay active. Exercise for at least 30 minutes on 5 or  more days each week.  Do not use any products that contain nicotine or tobacco, such as cigarettes, e-cigarettes, and chewing tobacco. If you need help quitting, ask your health care provider.  If you are sexually active, practice safe sex. Use a condom or other form of protection to prevent STIs (sexually transmitted infections). What's next?  Go to your health care provider once a year for a well check visit.  Ask your health care provider how often you should have your eyes and teeth checked.  Stay up to date on all vaccines. This information is not intended to replace advice given to you by your health care provider. Make sure you discuss any questions you have with your health care provider. Document Revised: 12/02/2018 Document Reviewed: 12/02/2018 Elsevier Patient Education  2020 [employer].

## 2021-04-02 ENCOUNTER — Other Ambulatory Visit: Payer: Self-pay

## 2021-04-02 ENCOUNTER — Ambulatory Visit (INDEPENDENT_AMBULATORY_CARE_PROVIDER_SITE_OTHER): Payer: 59 | Admitting: Family Medicine

## 2021-04-02 ENCOUNTER — Encounter: Payer: Self-pay | Admitting: Family Medicine

## 2021-04-02 VITALS — BP 137/79 | HR 77 | Ht 67.91 in | Wt 164.5 lb

## 2021-04-02 DIAGNOSIS — R7301 Impaired fasting glucose: Secondary | ICD-10-CM

## 2021-04-02 DIAGNOSIS — Z0000 Encounter for general adult medical examination without abnormal findings: Secondary | ICD-10-CM

## 2021-04-02 DIAGNOSIS — N469 Male infertility, unspecified: Secondary | ICD-10-CM | POA: Insufficient documentation

## 2021-04-02 DIAGNOSIS — E785 Hyperlipidemia, unspecified: Secondary | ICD-10-CM | POA: Diagnosis not present

## 2021-04-02 LAB — CBC WITH DIFFERENTIAL/PLATELET
Hemoglobin: 15.3 g/dL (ref 13.2–17.1)
Monocytes Relative: 8.8 %
RBC: 4.96 10*6/uL (ref 4.20–5.80)
Total Lymphocyte: 31.4 %

## 2021-04-02 NOTE — Assessment & Plan Note (Signed)
Well adult Orders Placed This Encounter  Procedures  . COMPLETE METABOLIC PANEL WITH GFR  . CBC with Differential  . Lipid Panel w/reflex Direct LDL  . HgB A1c  . Ambulatory referral to Endocrinology    Referral Priority:   Routine    Referral Type:   Consultation    Referral Reason:   Specialty Services Required    Referred to Provider:   Pa, [facility]    Number of Visits Requested:   1  Screenings: UTD Immunization: UTD Anticipatory guidance/Risk factor reduction:  Recommendations per AVS.

## 2021-04-02 NOTE — Patient Instructions (Signed)
Preventive Care 21-35 Years Old, Male Preventive care refers to lifestyle choices and visits with your health care provider that can promote health and wellness. This includes:  A yearly physical exam. This is also called an annual wellness visit.  Regular dental and eye exams.  Immunizations.  Screening for certain conditions.  Healthy lifestyle choices, such as: ? Eating a healthy diet. ? Getting regular exercise. ? Not using drugs or products that contain nicotine and tobacco. ? Limiting alcohol use. What can I expect for my preventive care visit? Physical exam Your health care provider may check your:  Height and weight. These may be used to calculate your BMI (body mass index). BMI is a measurement that tells if you are at a healthy weight.  Heart rate and blood pressure.  Body temperature.  Skin for abnormal spots. Counseling Your health care provider may ask you questions about your:  Past medical problems.  Family's medical history.  Alcohol, tobacco, and drug use.  Emotional well-being.  Home life and relationship well-being.  Sexual activity.  Diet, exercise, and sleep habits.  Work and work environment.  Access to firearms. What immunizations do I need? Vaccines are usually given at various ages, according to a schedule. Your health care provider will recommend vaccines for you based on your age, medical history, and lifestyle or other factors, such as travel or where you work.   What tests do I need? Blood tests  Lipid and cholesterol levels. These may be checked every 5 years starting at age 20.  Hepatitis C test.  Hepatitis B test. Screening  Diabetes screening. This is done by checking your blood sugar (glucose) after you have not eaten for a while (fasting).  Genital exam to check for testicular cancer or hernias.  STD (sexually transmitted disease) testing, if you are at risk. Talk with your health care provider about your test results,  treatment options, and if necessary, the need for more tests.   Follow these instructions at home: Eating and drinking  Eat a healthy diet that includes fresh fruits and vegetables, whole grains, lean protein, and low-fat dairy products.  Drink enough fluid to keep your urine pale yellow.  Take vitamin and mineral supplements as recommended by your health care provider.  Do not drink alcohol if your health care provider tells you not to drink.  If you drink alcohol: ? Limit how much you have to 0-2 drinks a day. ? Be aware of how much alcohol is in your drink. In the [place] one drink equals one 12 oz bottle of beer (355 mL), one 5 oz glass of wine (148 mL), or one 1 oz glass of hard liquor (44 mL).   Lifestyle  Take daily care of your teeth and gums. Brush your teeth every morning and night with fluoride toothpaste. Floss one time each day.  Stay active. Exercise for at least 30 minutes 5 or more days each week.  Do not use any products that contain nicotine or tobacco, such as cigarettes, e-cigarettes, and chewing tobacco. If you need help quitting, ask your health care provider.  Do not use drugs.  If you are sexually active, practice safe sex. Use a condom or other form of protection to prevent STIs (sexually transmitted infections).  Find healthy ways to cope with stress, such as: ? Meditation, yoga, or listening to music. ? Journaling. ? Talking to a trusted person. ? Spending time with friends and family. Safety  Always wear your seat belt while driving   or riding in a vehicle.  Do not drive: ? If you have been drinking alcohol. Do not ride with someone who has been drinking. ? When you are tired or distracted. ? While texting.  Wear a helmet and other protective equipment during sports activities.  If you have firearms in your house, make sure you follow all gun safety procedures.  Seek help if you have been physically or sexually abused. What's next?  Go to your  health care provider once a year for an annual wellness visit.  Ask your health care provider how often you should have your eyes and teeth checked.  Stay up to date on all vaccines. This information is not intended to replace advice given to you by your health care provider. Make sure you discuss any questions you have with your health care provider. Document Revised: 08/24/2019 Document Reviewed: 12/02/2018 Elsevier Patient Education  2021 Elsevier Inc.  

## 2021-04-02 NOTE — Assessment & Plan Note (Signed)
Referral ordered for [facility].

## 2021-04-02 NOTE — Progress Notes (Signed)
Casey Bowers - 35 y.o. male MRN [id number]  Date of birth: December 27, 1985  Subjective Chief Complaint  Patient presents with  . Annual Exam    HPI Casey Bowers is a 35 y.o. male here today for annual exam.  He has been in pretty good health.  He does have a history of HLD and occasional migraines.    He has had some issues with fertility, requests referral to [facility].  He has semen analysis previously showing low sperm motility.    He is a non-smoker.and consumes EtOH rarely.    He does exercise regularly.  Admits diet could be better.   Up to date on vaccines.   .Review of Systems  Constitutional: Negative for chills, fever, malaise/fatigue and weight loss.  HENT: Negative for congestion, ear pain and sore throat.   Eyes: Negative for blurred vision, double vision and pain.  Respiratory: Negative for cough and shortness of breath.   Cardiovascular: Negative for chest pain and palpitations.  Gastrointestinal: Negative for abdominal pain, blood in stool, constipation, heartburn and nausea.  Genitourinary: Negative for dysuria and urgency.  Musculoskeletal: Negative for joint pain and myalgias.  Neurological: Negative for dizziness and headaches.  Endo/Heme/Allergies: Does not bruise/bleed easily.  Psychiatric/Behavioral: Negative for depression. The patient is not nervous/anxious and does not have insomnia.       No Known Allergies  Past Medical History:  Diagnosis Date  . Hyperlipidemia     No past surgical history on file.  Social History   Socioeconomic History  . Marital status: Single    Spouse name: Not on file  . Number of children: Not on file  . Years of education: Not on file  . Highest education level: Not on file  Occupational History  . Not on file  Tobacco Use  . Smoking status: Never Smoker  . Smokeless tobacco: Never Used  Substance and Sexual Activity  . Alcohol use: Yes    Alcohol/week: 0.0 standard  drinks  . Drug use: No  . Sexual activity: Yes  Other Topics Concern  . Not on file  Social History Narrative  . Not on file   Social Determinants of Health   Financial Resource Strain: Not on file  Food Insecurity: Not on file  Transportation Needs: Not on file  Physical Activity: Not on file  Stress: Not on file  Social Connections: Not on file    No family history on file.  Health Maintenance  Topic Date Due  . COVID-19 Vaccine (3 - Booster for Pfizer series) 10/11/2020  . Hepatitis C Screening  06/12/2021 (Originally [birth year])  . INFLUENZA VACCINE  07/22/2021  . TETANUS/TDAP  02/11/2026  . HIV Screening  Completed  . HPV VACCINES  Aged Out     ----------------------------------------------------------------------------------------------------------------------------------------------------------------------------------------------------------------- Physical Exam BP 137/79 (BP Location: Left Arm, Patient Position: Sitting, Cuff Size: Normal)   Pulse 77   Ht 5' 7.91" (1.725 m)   Wt 164 lb 8 oz (74.6 kg)   SpO2 95%   BMI 25.08 kg/m   Physical Exam Constitutional:      General: He is not in acute distress. HENT:     Head: Normocephalic and atraumatic.     Right Ear: External ear normal.     Left Ear: External ear normal.  Eyes:     General: No scleral icterus. Neck:     Thyroid: No thyromegaly.  Cardiovascular:     Rate and Rhythm: Normal rate and regular rhythm.     Heart sounds:  Normal heart sounds.  Pulmonary:     Effort: Pulmonary effort is normal.     Breath sounds: Normal breath sounds.  Abdominal:     General: Bowel sounds are normal. There is no distension.     Palpations: Abdomen is soft.     Tenderness: There is no abdominal tenderness. There is no guarding.  Musculoskeletal:     Cervical back: Normal range of motion.  Lymphadenopathy:     Cervical: No cervical adenopathy.  Skin:    General: Skin is warm and dry.     Findings: No rash.   Neurological:     General: No focal deficit present.     Mental Status: He is alert and oriented to person, place, and time.     Cranial Nerves: No cranial nerve deficit.     Motor: No abnormal muscle tone.  Psychiatric:        Behavior: Behavior normal.     ------------------------------------------------------------------------------------------------------------------------------------------------------------------------------------------------------------------- Assessment and Plan  Well adult exam Well adult Orders Placed This Encounter  Procedures  . COMPLETE METABOLIC PANEL WITH GFR  . CBC with Differential  . Lipid Panel w/reflex Direct LDL  . HgB A1c  . Ambulatory referral to Endocrinology    Referral Priority:   Routine    Referral Type:   Consultation    Referral Reason:   Specialty Services Required    Referred to Provider:   Pa, [facility]    Number of Visits Requested:   1  Screenings: UTD Immunization: UTD Anticipatory guidance/Risk factor reduction:  Recommendations per AVS.   Infertility male Referral ordered for [facility].     No orders of the defined types were placed in this encounter.   No follow-ups on file.    This visit occurred during the SARS-CoV-2 public health emergency.  Safety protocols were in place, including screening questions prior to the visit, additional usage of staff PPE, and extensive cleaning of exam room while observing appropriate contact time as indicated for disinfecting solutions.

## 2021-04-03 LAB — CBC WITH DIFFERENTIAL/PLATELET
Absolute Monocytes: 546 cells/uL (ref 200–950)
Basophils Absolute: 62 cells/uL (ref 0–200)
Basophils Relative: 1 %
Eosinophils Absolute: 304 cells/uL (ref 15–500)
Eosinophils Relative: 4.9 %
HCT: 45 % (ref 38.5–50.0)
Lymphs Abs: 1947 cells/uL (ref 850–3900)
MCH: 30.8 pg (ref 27.0–33.0)
MCHC: 34 g/dL (ref 32.0–36.0)
MCV: 90.7 fL (ref 80.0–100.0)
MPV: 11 fL (ref 7.5–12.5)
Neutro Abs: 3342 cells/uL (ref 1500–7800)
Neutrophils Relative %: 53.9 %
Platelets: 259 10*3/uL (ref 140–400)
RDW: 12.2 % (ref 11.0–15.0)
WBC: 6.2 10*3/uL (ref 3.8–10.8)

## 2021-04-03 LAB — COMPLETE METABOLIC PANEL WITH GFR
AG Ratio: 1.8 (calc) (ref 1.0–2.5)
ALT: 25 U/L (ref 9–46)
AST: 20 U/L (ref 10–40)
Albumin: 4.4 g/dL (ref 3.6–5.1)
Alkaline phosphatase (APISO): 102 U/L (ref 36–130)
BUN: 13 mg/dL (ref 7–25)
CO2: 27 mmol/L (ref 20–32)
Calcium: 9.3 mg/dL (ref 8.6–10.3)
Chloride: 106 mmol/L (ref 98–110)
Creat: 0.71 mg/dL (ref 0.60–1.35)
GFR, Est African American: 142 mL/min/{1.73_m2} (ref >=60)
GFR, Est Non African American: 122 mL/min/{1.73_m2} (ref >=60)
Globulin: 2.5 g/dL (calc) (ref 1.9–3.7)
Glucose, Bld: 115 mg/dL — ABNORMAL HIGH (ref 65–99)
Potassium: 3.9 mmol/L (ref 3.5–5.3)
Sodium: 141 mmol/L (ref 135–146)
Total Bilirubin: 0.4 mg/dL (ref 0.2–1.2)
Total Protein: 6.9 g/dL (ref 6.1–8.1)

## 2021-04-03 LAB — HEMOGLOBIN A1C
Hgb A1c MFr Bld: 5.9 % of total Hgb — ABNORMAL HIGH (ref <5.7)
Mean Plasma Glucose: 123 mg/dL
eAG (mmol/L): 6.8 mmol/L

## 2021-04-03 LAB — LIPID PANEL W/REFLEX DIRECT LDL
Cholesterol: 208 mg/dL — ABNORMAL HIGH (ref <200)
HDL: 34 mg/dL — ABNORMAL LOW (ref >=40)
Non-HDL Cholesterol (Calc): 174 mg/dL (calc) — ABNORMAL HIGH (ref <130)
Total CHOL/HDL Ratio: 6.1 (calc) — ABNORMAL HIGH (ref <5.0)
Triglycerides: 649 mg/dL — ABNORMAL HIGH (ref <150)

## 2021-04-03 LAB — DIRECT LDL: Direct LDL: 71 mg/dL (ref <100)

## 2021-05-15 ENCOUNTER — Telehealth: Payer: Self-pay

## 2021-05-15 NOTE — Telephone Encounter (Signed)
Patient called this morning stating no contact from [facility].  Msg sent to Referral Coordinator

## 2021-05-17 NOTE — Telephone Encounter (Signed)
I called and spoke with patient and gave him the phone number to [facility] where his referral was sent so he could go ahead and call to schedule his appointment. - CF

## 2021-07-02 ENCOUNTER — Telehealth: Payer: Self-pay

## 2021-07-02 NOTE — Telephone Encounter (Signed)
Pt lvm requesting callback concerning [facility] testing.  Returned patient's call. He stated that [facility] said they had faxed his semen analysis results to Dr. Ashley Royalty.   Advised no result information has been received from Dr. Ashley Royalty.  Contacted  Infertility. LVM requesting results be faxed to [phone]) Waiting for response or fax received.

## 2021-07-05 ENCOUNTER — Other Ambulatory Visit: Payer: Self-pay | Admitting: Family Medicine

## 2021-07-05 ENCOUNTER — Encounter: Payer: Self-pay | Admitting: Family Medicine

## 2021-07-05 DIAGNOSIS — E3452 Partial androgen insensitivity syndrome: Secondary | ICD-10-CM

## 2021-07-05 NOTE — Progress Notes (Signed)
Mr. Casey Bowers has been advised of the Urology referral.

## 2021-07-05 NOTE — Progress Notes (Unsigned)
Low sperm viability on semen analysis.  Urology referral recommended.  Orders placed.

## 2022-02-11 IMAGING — DX DG HAND COMPLETE 3+V*L*
3 series · 3 of 3 positions shown · non-contrast
Comparison: None.

CLINICAL DATA: Left middle finger pain at the PIP joint for 2 days

EXAM:
LEFT HAND - COMPLETE 3+ VIEW

[hand pa]
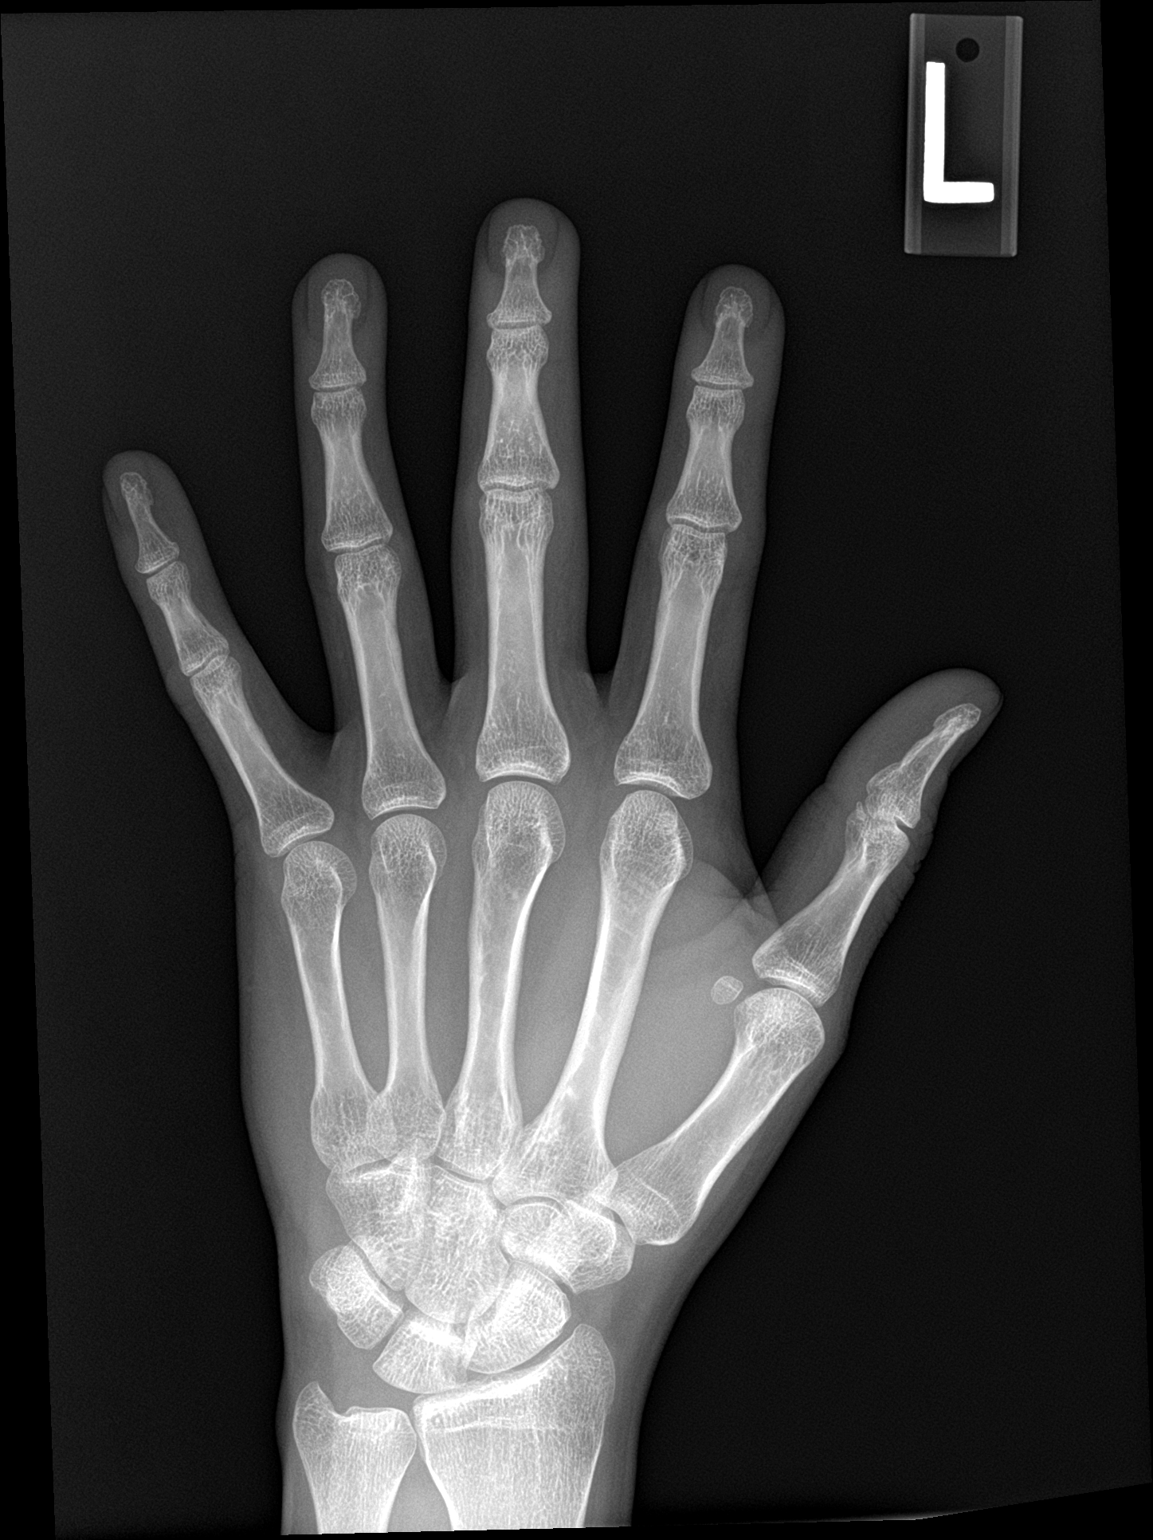

[hand obl]
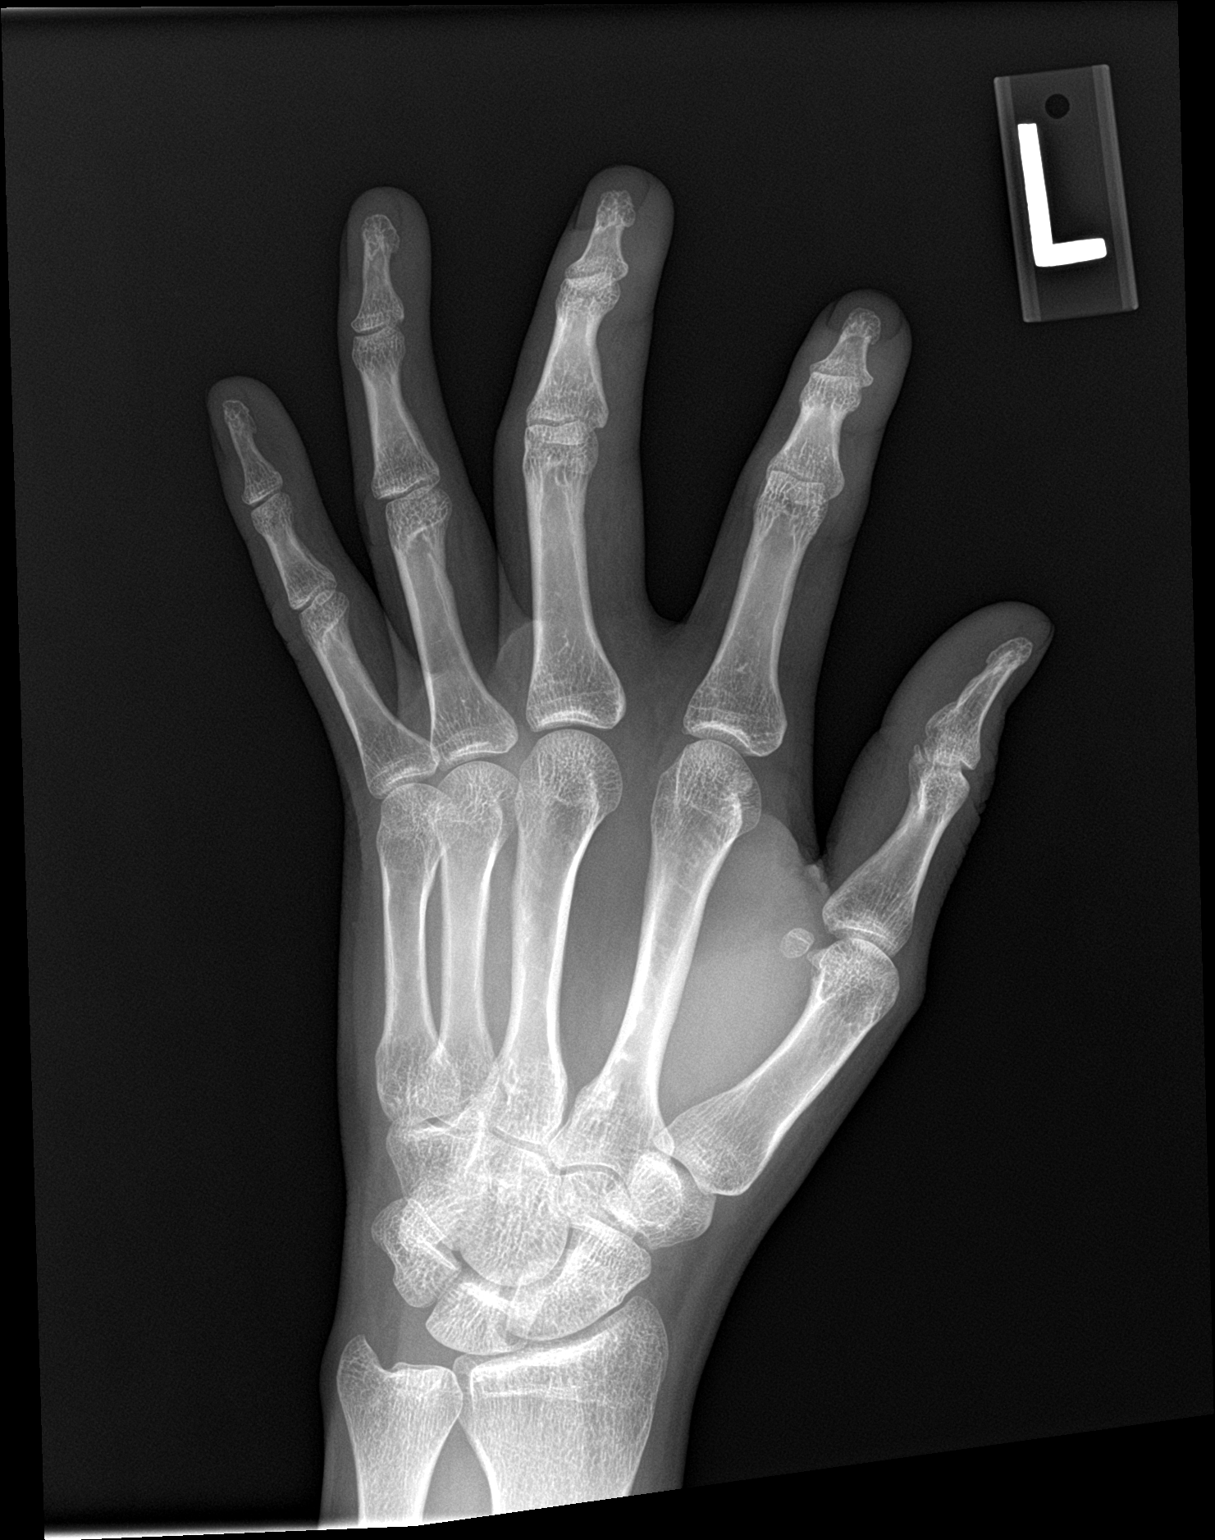

[hand lat]
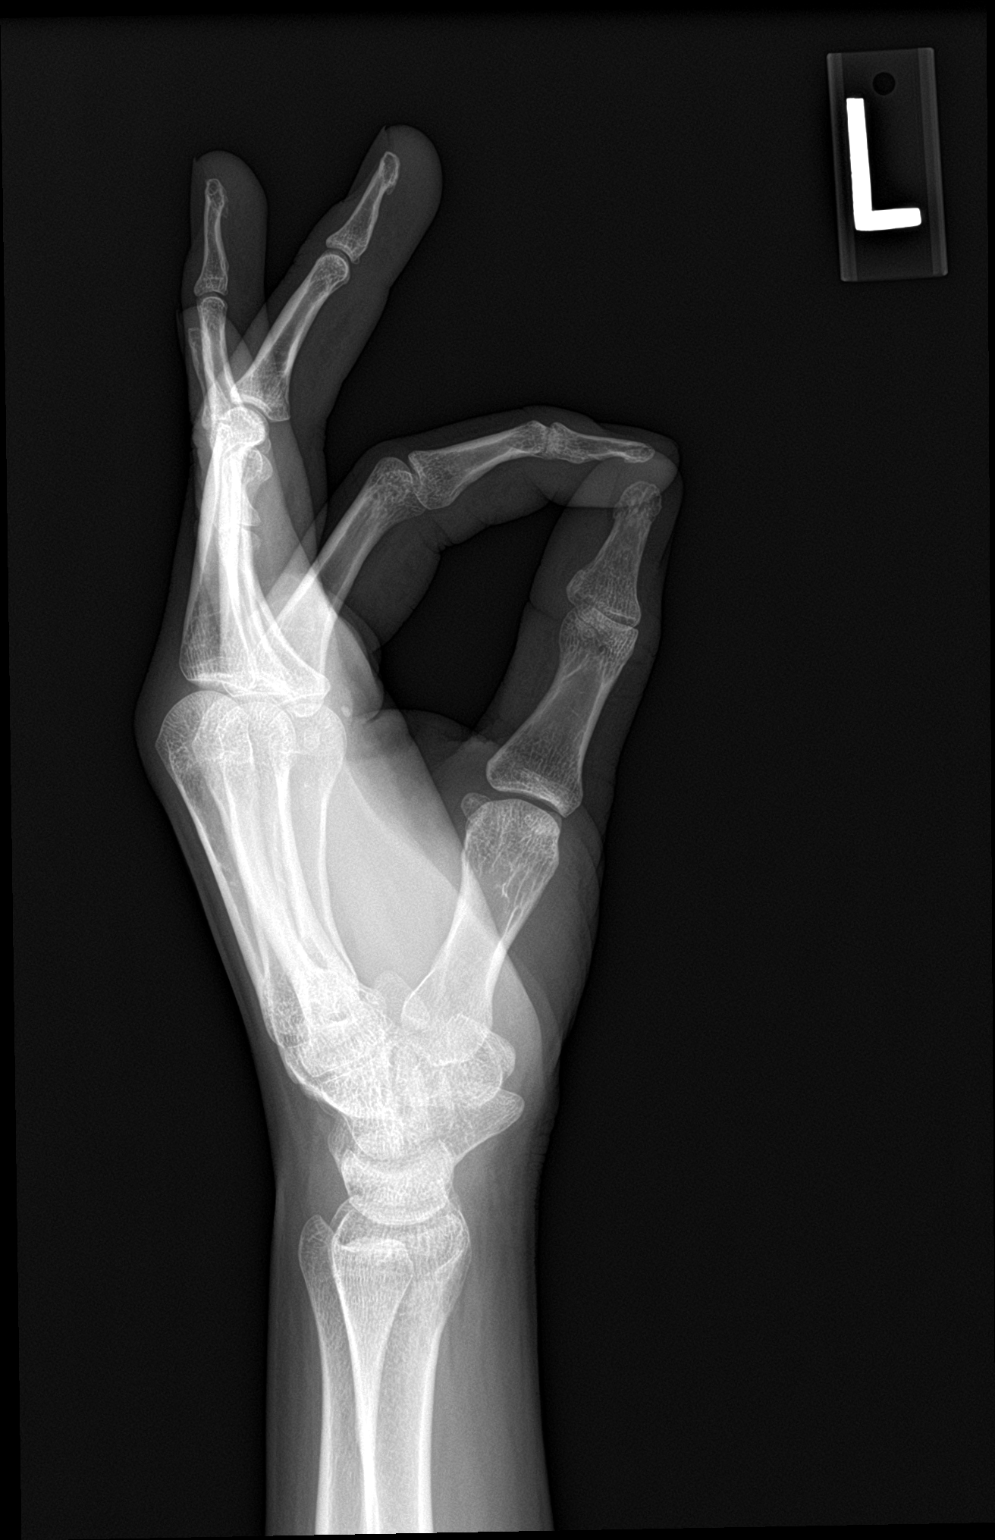

[3 of 3 positions shown; findings below may reference images not displayed]

FINDINGS: There is no evidence of fracture or dislocation. No bony erosion or
periostitis. There is no evidence of arthropathy or other focal bone
abnormality. Soft tissues are unremarkable.
IMPRESSION: Negative.

## 2022-04-08 ENCOUNTER — Ambulatory Visit (INDEPENDENT_AMBULATORY_CARE_PROVIDER_SITE_OTHER): Payer: 59 | Admitting: Physician Assistant

## 2022-04-08 ENCOUNTER — Encounter: Payer: Self-pay | Admitting: Physician Assistant

## 2022-04-08 VITALS — BP 120/73 | HR 68 | Ht 68.0 in | Wt 166.0 lb

## 2022-04-08 DIAGNOSIS — Z0000 Encounter for general adult medical examination without abnormal findings: Secondary | ICD-10-CM | POA: Diagnosis not present

## 2022-04-08 DIAGNOSIS — E785 Hyperlipidemia, unspecified: Secondary | ICD-10-CM

## 2022-04-08 DIAGNOSIS — Z1329 Encounter for screening for other suspected endocrine disorder: Secondary | ICD-10-CM | POA: Diagnosis not present

## 2022-04-08 DIAGNOSIS — R7301 Impaired fasting glucose: Secondary | ICD-10-CM | POA: Diagnosis not present

## 2022-04-08 NOTE — Patient Instructions (Signed)
Health Maintenance, Male Adopting a healthy lifestyle and getting preventive care are important in promoting health and wellness. Ask your health care provider about: The right schedule for you to have regular tests and exams. Things you can do on your own to prevent diseases and keep yourself healthy. What should I know about diet, weight, and exercise? Eat a healthy diet  Eat a diet that includes plenty of vegetables, fruits, low-fat dairy products, and lean protein. Do not eat a lot of foods that are high in solid fats, added sugars, or sodium. Maintain a healthy weight Body mass index (BMI) is a measurement that can be used to identify possible weight problems. It estimates body fat based on height and weight. Your health care provider can help determine your BMI and help you achieve or maintain a healthy weight. Get regular exercise Get regular exercise. This is one of the most important things you can do for your health. Most adults should: Exercise for at least 150 minutes each week. The exercise should increase your heart rate and make you sweat (moderate-intensity exercise). Do strengthening exercises at least twice a week. This is in addition to the moderate-intensity exercise. Spend less time sitting. Even light physical activity can be beneficial. Watch cholesterol and blood lipids Have your blood tested for lipids and cholesterol at 36 years of age, then have this test every 5 years. You may need to have your cholesterol levels checked more often if: Your lipid or cholesterol levels are high. You are older than 36 years of age. You are at high risk for heart disease. What should I know about cancer screening? Many types of cancers can be detected early and may often be prevented. Depending on your health history and family history, you may need to have cancer screening at various ages. This may include screening for: Colorectal cancer. Prostate cancer. Skin cancer. Lung  cancer. What should I know about heart disease, diabetes, and high blood pressure? Blood pressure and heart disease High blood pressure causes heart disease and increases the risk of stroke. This is more likely to develop in people who have high blood pressure readings or are overweight. Talk with your health care provider about your target blood pressure readings. Have your blood pressure checked: Every 3-5 years if you are 18-39 years of age. Every year if you are 40 years old or older. If you are between the ages of 65 and 75 and are a current or former smoker, ask your health care provider if you should have a one-time screening for abdominal aortic aneurysm (AAA). Diabetes Have regular diabetes screenings. This checks your fasting blood sugar level. Have the screening done: Once every three years after age 45 if you are at a normal weight and have a low risk for diabetes. More often and at a younger age if you are overweight or have a high risk for diabetes. What should I know about preventing infection? Hepatitis B If you have a higher risk for hepatitis B, you should be screened for this virus. Talk with your health care provider to find out if you are at risk for hepatitis B infection. Hepatitis C Blood testing is recommended for: Everyone born from 1945 through 1965. Anyone with known risk factors for hepatitis C. Sexually transmitted infections (STIs) You should be screened each year for STIs, including gonorrhea and chlamydia, if: You are sexually active and are younger than 36 years of age. You are older than 36 years of age and your   health care provider tells you that you are at risk for this type of infection. Your sexual activity has changed since you were last screened, and you are at increased risk for chlamydia or gonorrhea. Ask your health care provider if you are at risk. Ask your health care provider about whether you are at high risk for HIV. Your health care provider  may recommend a prescription medicine to help prevent HIV infection. If you choose to take medicine to prevent HIV, you should first get tested for HIV. You should then be tested every 3 months for as long as you are taking the medicine. Follow these instructions at home: Alcohol use Do not drink alcohol if your health care provider tells you not to drink. If you drink alcohol: Limit how much you have to 0-2 drinks a day. Know how much alcohol is in your drink. In the [place] one drink equals one 12 oz bottle of beer (355 mL), one 5 oz glass of wine (148 mL), or one 1 oz glass of hard liquor (44 mL). Lifestyle Do not use any products that contain nicotine or tobacco. These products include cigarettes, chewing tobacco, and vaping devices, such as e-cigarettes. If you need help quitting, ask your health care provider. Do not use street drugs. Do not share needles. Ask your health care provider for help if you need support or information about quitting drugs. General instructions Schedule regular health, dental, and eye exams. Stay current with your vaccines. Tell your health care provider if: You often feel depressed. You have ever been abused or do not feel safe at home. Summary Adopting a healthy lifestyle and getting preventive care are important in promoting health and wellness. Follow your health care provider's instructions about healthy diet, exercising, and getting tested or screened for diseases. Follow your health care provider's instructions on monitoring your cholesterol and blood pressure. This information is not intended to replace advice given to you by your health care provider. Make sure you discuss any questions you have with your health care provider. Document Revised: 04/29/2021 Document Reviewed: 04/29/2021 Elsevier Patient Education  2023 [employer] Inc[employer]  

## 2022-04-08 NOTE — Progress Notes (Signed)
? ?Complete physical exam ? ?Patient: Casey Bowers   DOB: 1986-02-06   36 y.o. Male  MRN: [id number] ? ?Subjective:  ?  ?Chief Complaint  ?Patient presents with  ? Annual Exam  ? ? ?Casey Bowers is a 36 y.o. male who presents today for a complete physical exam. He reports consuming a general diet. The patient does not participate in regular exercise at present. He generally feels fairly well. He reports sleeping well. He does not have additional problems to discuss today.  ? ? ?Most recent fall risk assessment: ? ?  04/08/2022  ? 10:54 AM  ?Fall Risk   ?Falls in the past year? 0  ?Number falls in past yr: 0  ?Injury with Fall? 0  ?Risk for fall due to : No Fall Risks  ?Follow up Falls evaluation completed  ? ?  ?Most recent depression screenings: ? ?  04/08/2022  ? 10:54 AM 04/02/2021  ?  8:53 AM  ?PHQ 2/9 Scores  ?PHQ - 2 Score 1 0  ? ? ?Dental: No current dental problems and Receives regular dental care ? ?Past Medical History:  ?Diagnosis Date  ? Hyperlipidemia   ? ?  ? ?Patient Care Team: ?Casey Nutting, DO as PCP - General (Family Medicine)  ? ?Outpatient Medications Prior to Visit  ?Medication Sig  ? clomiPHENE (CLOMID) 50 MG tablet SMARTSIG:0.5 Tablet(s) By Mouth 3 Times a Week  ? ?No facility-administered medications prior to visit.  ? ? ?Review of Systems  ?All other systems reviewed and are negative. ? ? ? ? ?   ?Objective:  ? ?  ?BP 120/73   Pulse 68   Ht _0  (1.727 m)   Wt 166 lb (75.3 kg)   SpO2 98%   BMI 25.24 kg/m?  ?BP Readings from Last 3 Encounters:  ?04/08/22 120/73  ?04/02/21 137/79  ?08/08/20 106/70  ? ?  ? ?Physical Exam ?Vitals reviewed.  ?Constitutional:   ?   Appearance: Normal appearance.  ?HENT:  ?   Head: Normocephalic.  ?   Right Ear: Tympanic membrane, ear canal and external ear normal. There is no impacted cerumen.  ?   Left Ear: Tympanic membrane, ear canal and external ear normal. There is no impacted cerumen.  ?   Nose: Nose normal.  ?   Mouth/Throat:  ?    Mouth: Mucous membranes are moist.  ?   Pharynx: No oropharyngeal exudate or posterior oropharyngeal erythema.  ?Eyes:  ?   Extraocular Movements: Extraocular movements intact.  ?   Conjunctiva/sclera: Conjunctivae normal.  ?   Pupils: Pupils are equal, round, and reactive to light.  ?Neck:  ?   Vascular: No carotid bruit.  ?Cardiovascular:  ?   Rate and Rhythm: Normal rate and regular rhythm.  ?   Pulses: Normal pulses.  ?   Heart sounds: Normal heart sounds.  ?Pulmonary:  ?   Effort: Pulmonary effort is normal.  ?   Breath sounds: Normal breath sounds.  ?Abdominal:  ?   General: Bowel sounds are normal. There is no distension.  ?   Palpations: Abdomen is soft. There is no mass.  ?   Tenderness: There is no abdominal tenderness. There is no right CVA tenderness, left CVA tenderness, guarding or rebound.  ?   Hernia: No hernia is present.  ?Musculoskeletal:  ?   Cervical back: Normal range of motion and neck supple.  ?   Right lower leg: No edema.  ?   Left lower leg: No  edema.  ?Lymphadenopathy:  ?   Cervical: No cervical adenopathy.  ?Neurological:  ?   General: No focal deficit present.  ?   Mental Status: He is alert and oriented to person, place, and time.  ?Psychiatric:     ?   Mood and Affect: Mood normal.  ?  ? ?No results found for any visits on 04/08/22. ?Last CBC ?Lab Results  ?Component Value Date  ? WBC 6.2 04/02/2021  ? HGB 15.3 04/02/2021  ? HCT 45.0 04/02/2021  ? MCV 90.7 04/02/2021  ? MCH 30.8 04/02/2021  ? RDW 12.2 04/02/2021  ? PLT 259 04/02/2021  ? ?Last metabolic panel ?Lab Results  ?Component Value Date  ? GLUCOSE 115 (H) 04/02/2021  ? NA 141 04/02/2021  ? K 3.9 04/02/2021  ? CL 106 04/02/2021  ? CO2 27 04/02/2021  ? BUN 13 04/02/2021  ? CREATININE 0.71 04/02/2021  ? GFRNONAA 122 04/02/2021  ? CALCIUM 9.3 04/02/2021  ? PROT 6.9 04/02/2021  ? ALBUMIN 4.4 02/12/2016  ? BILITOT 0.4 04/02/2021  ? ALKPHOS 84 02/12/2016  ? AST 20 04/02/2021  ? ALT 25 04/02/2021  ? ?Last lipids ?Lab Results   ?Component Value Date  ? CHOL 208 (H) 04/02/2021  ? HDL 34 (L) 04/02/2021  ? Casey Bowers  04/02/2021  ?   Comment:  ?   . ?LDL cholesterol not calculated. Triglyceride levels ?greater than 400 mg/dL invalidate calculated LDL results. ?. ?Reference range: <100 ?Casey Bowers Kitchen ?Desirable range <100 mg/dL for primary prevention;   ?<70 mg/dL for patients with CHD or diabetic patients  ?with > or = 2 CHD risk factors. ?. ?LDL-C is now calculated using the Martin-Hopkins  ?calculation, which is a validated novel method providing  ?better accuracy than the Friedewald equation in the  ?estimation of LDL-C.  ?Cresenciano Genre et al. Annamaria Helling. 7353;299(24): 2061-2068  ?(http://education.[facility].com/faq/FAQ164) ?  ? LDLDIRECT 71 04/02/2021  ? TRIG 649 (H) 04/02/2021  ? CHOLHDL 6.1 (H) 04/02/2021  ? ?Last hemoglobin A1c ?Lab Results  ?Component Value Date  ? HGBA1C 5.9 (H) 04/02/2021  ? ?Last thyroid functions ?No results found for: TSH, T3TOTAL, T4TOTAL, THYROIDAB ?  ?   ?Assessment & Plan:  ?  ?Routine Health Maintenance and Physical Exam ? ?Immunization History  ?Administered Date(s) Administered  ? Influenza Inj Mdck Quad Pf 11/15/2017  ? Influenza-Unspecified 10/23/2015, 10/11/2020  ? MMR 07/28/2000, 09/07/2020  ? PFIZER(Purple Top)SARS-COV-2 Vaccination 03/14/2020, 04/11/2020  ? Td (Adult) 07/28/2000  ? Tdap 02/12/2016  ? ? ?Health Maintenance  ?Topic Date Due  ? Hepatitis C Screening  Never done  ? COVID-19 Vaccine (3 - Booster for Pfizer series) 04/24/2022 (Originally 06/06/2020)  ? INFLUENZA VACCINE  07/22/2022  ? TETANUS/TDAP  02/11/2026  ? HIV Screening  Completed  ? HPV VACCINES  Aged Out  ? ? ?Discussed health benefits of physical activity, and encouraged him to engage in regular exercise appropriate for his age and condition. ? ?Casey Bowers KitchenKittie Bowers was seen today for annual exam. ? ?Diagnoses and all orders for this visit: ? ?Well adult exam ?-     TSH ?-     Lipid Panel w/reflex Direct LDL ?-     COMPLETE METABOLIC PANEL WITH GFR ?-     CBC  with Differential/Platelet ? ?Dyslipidemia ?-     Lipid Panel w/reflex Direct LDL ? ?Elevated fasting glucose ?-     COMPLETE METABOLIC PANEL WITH GFR ? ?Thyroid disorder screen ?-     TSH ? ? ? ? ?Return in about 1  year (around 04/09/2023). ? ?  ? ?Iran Planas, PA-C ? ? ?

## 2022-04-09 ENCOUNTER — Encounter: Payer: Self-pay | Admitting: Physician Assistant

## 2022-04-09 DIAGNOSIS — E781 Pure hyperglyceridemia: Secondary | ICD-10-CM | POA: Insufficient documentation

## 2022-04-09 LAB — COMPLETE METABOLIC PANEL WITH GFR
AG Ratio: 1.8 (calc) (ref 1.0–2.5)
ALT: 21 U/L (ref 9–46)
AST: 20 U/L (ref 10–40)
Albumin: 4.4 g/dL (ref 3.6–5.1)
Alkaline phosphatase (APISO): 93 U/L (ref 36–130)
BUN: 15 mg/dL (ref 7–25)
CO2: 24 mmol/L (ref 20–32)
Calcium: 9 mg/dL (ref 8.6–10.3)
Chloride: 104 mmol/L (ref 98–110)
Creat: 0.8 mg/dL (ref 0.60–1.26)
Globulin: 2.4 g/dL (calc) (ref 1.9–3.7)
Glucose, Bld: 95 mg/dL (ref 65–99)
Potassium: 4.4 mmol/L (ref 3.5–5.3)
Sodium: 140 mmol/L (ref 135–146)
Total Bilirubin: 0.6 mg/dL (ref 0.2–1.2)
Total Protein: 6.8 g/dL (ref 6.1–8.1)
eGFR: 118 mL/min/{1.73_m2} (ref >=60)

## 2022-04-09 LAB — CBC WITH DIFFERENTIAL/PLATELET
Absolute Monocytes: 329 cells/uL (ref 200–950)
Basophils Absolute: 42 cells/uL (ref 0–200)
Basophils Relative: 0.8 %
Eosinophils Absolute: 101 cells/uL (ref 15–500)
Eosinophils Relative: 1.9 %
HCT: 46.1 % (ref 38.5–50.0)
Hemoglobin: 15.7 g/dL (ref 13.2–17.1)
Lymphs Abs: 1940 cells/uL (ref 850–3900)
MCH: 31.3 pg (ref 27.0–33.0)
MCHC: 34.1 g/dL (ref 32.0–36.0)
MCV: 91.8 fL (ref 80.0–100.0)
MPV: 11.8 fL (ref 7.5–12.5)
Monocytes Relative: 6.2 %
Neutro Abs: 2889 cells/uL (ref 1500–7800)
Neutrophils Relative %: 54.5 %
Platelets: 225 10*3/uL (ref 140–400)
RBC: 5.02 10*6/uL (ref 4.20–5.80)
RDW: 12.3 % (ref 11.0–15.0)
Total Lymphocyte: 36.6 %
WBC: 5.3 10*3/uL (ref 3.8–10.8)

## 2022-04-09 LAB — TSH: TSH: 1.3 mIU/L (ref 0.40–4.50)

## 2022-04-09 LAB — LIPID PANEL W/REFLEX DIRECT LDL
Cholesterol: 187 mg/dL (ref <200)
HDL: 40 mg/dL (ref >=40)
LDL Cholesterol (Calc): 116 mg/dL (calc) — ABNORMAL HIGH
Non-HDL Cholesterol (Calc): 147 mg/dL (calc) — ABNORMAL HIGH (ref <130)
Total CHOL/HDL Ratio: 4.7 (calc) (ref <5.0)
Triglycerides: 185 mg/dL — ABNORMAL HIGH (ref <150)

## 2022-04-09 NOTE — Progress Notes (Signed)
Kidney, liver, and glucose look great.  ?Thyroid looks great.  ?TG are MUCH better. Continue to work on diet and exercise. Ok to start a daily fish oil 4000mg  a day.  ?Cholesterol looks good.  ?

## 2022-08-13 DIAGNOSIS — Z3141 Encounter for fertility testing: Secondary | ICD-10-CM | POA: Diagnosis not present

## 2022-08-29 DIAGNOSIS — E291 Testicular hypofunction: Secondary | ICD-10-CM | POA: Diagnosis not present

## 2022-08-29 DIAGNOSIS — N4611 Organic oligospermia: Secondary | ICD-10-CM | POA: Diagnosis not present

## 2022-09-26 DIAGNOSIS — L0390 Cellulitis, unspecified: Secondary | ICD-10-CM | POA: Diagnosis not present

## 2022-09-26 DIAGNOSIS — L237 Allergic contact dermatitis due to plants, except food: Secondary | ICD-10-CM | POA: Diagnosis not present

## 2022-10-02 DIAGNOSIS — L0390 Cellulitis, unspecified: Secondary | ICD-10-CM | POA: Diagnosis not present

## 2022-10-02 DIAGNOSIS — L237 Allergic contact dermatitis due to plants, except food: Secondary | ICD-10-CM | POA: Diagnosis not present

## 2023-04-20 ENCOUNTER — Ambulatory Visit: Payer: BC Managed Care – PPO | Admitting: Family Medicine

## 2023-04-20 ENCOUNTER — Encounter: Payer: Self-pay | Admitting: Family Medicine

## 2023-04-20 VITALS — BP 118/76 | HR 70 | Ht 68.0 in | Wt 171.0 lb

## 2023-04-20 DIAGNOSIS — H60502 Unspecified acute noninfective otitis externa, left ear: Secondary | ICD-10-CM | POA: Diagnosis not present

## 2023-04-20 DIAGNOSIS — H6090 Unspecified otitis externa, unspecified ear: Secondary | ICD-10-CM | POA: Insufficient documentation

## 2023-04-20 MED ORDER — NEOMYCIN-POLYMYXIN-HC 3.5-10000-1 OT SUSP: 4.0000 [drp] | Freq: Four times a day (QID) | OTIC | 0 refills | Status: AC

## 2023-04-20 NOTE — Patient Instructions (Signed)
Try ear drops.  Call back for appt if symptoms persist or worsen.

## 2023-04-20 NOTE — Assessment & Plan Note (Signed)
Treating for OE given appearance of L EAC. Return if symptoms continue to worsen or developing new symptoms.

## 2023-04-20 NOTE — Progress Notes (Signed)
Casey Bowers - 37 y.o. male MRN [id number]  Date of birth: 1986-04-03  Subjective Chief Complaint  Patient presents with   Otalgia    HPI Casey Bowers is a 37 y.o. male here today with complaint of L ear pain.  Symptoms started about 4-5 days ago.  He has pain with muffled feeling in the L ear.   He has had some increased allergy symptoms recently.  He is taking generic allegra.  He is using Qtips and debrox solution as well.   He denies fever, chills, sinus pain, dizziness or headache.    ROS:  A comprehensive ROS was completed and negative except as noted per HPI  No Known Allergies  Past Medical History:  Diagnosis Date   Hyperlipidemia     History reviewed. No pertinent surgical history.  Social History   Socioeconomic History   Marital status: Single    Spouse name: Not on file   Number of children: Not on file   Years of education: Not on file   Highest education level: Not on file  Occupational History   Not on file  Tobacco Use   Smoking status: Never   Smokeless tobacco: Never  Substance and Sexual Activity   Alcohol use: Yes    Alcohol/week: 0.0 standard drinks of alcohol   Drug use: No   Sexual activity: Yes  Other Topics Concern   Not on file  Social History Narrative   Not on file   Social Determinants of Health   Financial Resource Strain: Not on file  Food Insecurity: Not on file  Transportation Needs: Not on file  Physical Activity: Not on file  Stress: Not on file  Social Connections: Not on file    History reviewed. No pertinent family history.  Health Maintenance  Topic Date Due   COVID-19 Vaccine (3 - 2023-24 season) 10/23/2023 (Originally 08/22/2022)   Hepatitis C Screening  04/19/2024 (Originally 09/25/2004)   INFLUENZA VACCINE  07/23/2023   DTaP/Tdap/Td (3 - Td or Tdap) 02/11/2026   HIV Screening  Completed   HPV VACCINES  Aged Out      ----------------------------------------------------------------------------------------------------------------------------------------------------------------------------------------------------------------- Physical Exam BP 118/76 (BP Location: Left Arm, Patient Position: Sitting, Cuff Size: Normal)   Pulse 70   Ht 5\' 8"  (1.727 m)   Wt 171 lb (77.6 kg)   SpO2 96%   BMI 26.00 kg/m   Physical Exam Constitutional:      Appearance: Normal appearance.  HENT:     Head: Normocephalic and atraumatic.     Ears:     Comments: L ear with cerumen impaction.  Lavage completed, still unable to visualize TM.  He does have quite a bit of irritation and erythema with tenderness along the canal.   Eyes:     General: No scleral icterus. Musculoskeletal:     Cervical back: Neck supple.  Neurological:     Mental Status: He is alert.  Psychiatric:        Mood and Affect: Mood normal.        Behavior: Behavior normal.     ------------------------------------------------------------------------------------------------------------------------------------------------------------------------------------------------------------------- Assessment and Plan  No problem-specific Assessment & Plan notes found for this encounter.   No orders of the defined types were placed in this encounter.   No follow-ups on file.    This visit occurred during the SARS-CoV-2 public health emergency.  Safety protocols were in place, including screening questions prior to the visit, additional usage of staff PPE, and extensive cleaning of exam room while observing appropriate contact  time as indicated for disinfecting solutions.

## 2023-06-23 ENCOUNTER — Encounter: Payer: Self-pay | Admitting: Family Medicine

## 2023-06-23 ENCOUNTER — Ambulatory Visit: Payer: BC Managed Care – PPO | Admitting: Family Medicine

## 2023-06-23 VITALS — BP 128/78 | HR 66 | Ht 68.0 in | Wt 172.0 lb

## 2023-06-23 DIAGNOSIS — B356 Tinea cruris: Secondary | ICD-10-CM

## 2023-06-23 MED ORDER — ITRACONAZOLE 100 MG PO CAPS: 200.0000 mg | ORAL_CAPSULE | Freq: Every day | ORAL | 0 refills | Status: DC

## 2023-06-23 MED ORDER — TERBINAFINE HCL 1 % EX CREA: 1.0000 | TOPICAL_CREAM | Freq: Two times a day (BID) | CUTANEOUS | 1 refills | Status: DC

## 2023-06-23 NOTE — Progress Notes (Signed)
   Acute Office Visit  Subjective:     Patient ID: Casey Bowers, male    DOB: 08-15-1986, 37 y.o.   MRN: [id number]  Chief Complaint  Patient presents with   fungal skin infection    Pt reports whenever it gets hot he starts to break out. His groin area is the worse and its worse at night. He has been using an OTC cream Sarna this is an Anti itch cream    HPI Patient is in today for Pt reports whenever it gets hot he starts to break out. His groin area is the worse and its worse at night. He has been using an OTC cream Sarna this is an Anti itch cream    ROS      Objective:    BP 128/78   Pulse 66   Ht 5\' 8"  (1.727 m)   Wt 172 lb (78 kg)   SpO2 96%   BMI 26.15 kg/m    Physical Exam Vitals reviewed.  Constitutional:      Appearance: He is well-developed.  HENT:     Head: Normocephalic and atraumatic.  Eyes:     Conjunctiva/sclera: Conjunctivae normal.  Cardiovascular:     Rate and Rhythm: Normal rate.  Pulmonary:     Effort: Pulmonary effort is normal.  Skin:    General: Skin is dry.     Coloration: Skin is not pale.     Comments: Well demarcated purple confluent rash on both inner thigh, scrotum and perineal area with some excoriations.   Neurological:     Mental Status: He is alert and oriented to person, place, and time.  Psychiatric:        Behavior: Behavior normal.     No results found for any visits on 06/23/23.      Assessment & Plan:   Problem List Items Addressed This Visit       Musculoskeletal and Integument   Tinea cruris - Primary   Relevant Medications   itraconazole (SPORANOX) 100 MG capsule   terbinafine (LAMISIL) 1 % cream   Tinea cruris vs erythrasma.  Will tx with sporanox and topical lamisil.    Meds ordered this encounter  Medications   itraconazole (SPORANOX) 100 MG capsule    Sig: Take 2 capsules (200 mg total) by mouth daily. X 1 week    Dispense:  14 capsule    Refill:  0   terbinafine (LAMISIL) 1 % cream     Sig: Apply 1 Application topically 2 (two) times daily.    Dispense:  42 g    Refill:  1    No follow-ups on file.  Nani Gasser, MD

## 2023-10-09 ENCOUNTER — Ambulatory Visit: Payer: BC Managed Care – PPO | Admitting: Family Medicine

## 2023-10-09 ENCOUNTER — Encounter: Payer: Self-pay | Admitting: Family Medicine

## 2023-10-09 VITALS — BP 125/81 | HR 81 | Ht 68.0 in | Wt 168.5 lb

## 2023-10-09 DIAGNOSIS — J029 Acute pharyngitis, unspecified: Secondary | ICD-10-CM | POA: Insufficient documentation

## 2023-10-09 MED ORDER — METHYLPREDNISOLONE 4 MG PO TBPK: ORAL_TABLET | ORAL | 0 refills | Status: DC

## 2023-10-09 NOTE — Progress Notes (Signed)
   Acute Office Visit  Subjective:     Patient ID: Casey Bowers, male    DOB: 1986-02-13, 37 y.o.   MRN: [id number]  Chief Complaint  Patient presents with   Sore Throat    Congestion right ear pain    Sore Throat  Associated symptoms include congestion and ear pain. Pertinent negatives include no coughing, headaches or shortness of breath.   Patient is in today for congestion, sore throat, and R ear pain for 3 days.   Review of Systems  Constitutional:  Negative for chills and fever.  HENT:  Positive for congestion, ear pain and sore throat.   Respiratory:  Negative for cough and shortness of breath.   Cardiovascular:  Negative for chest pain.  Neurological:  Negative for headaches.        Objective:    BP 125/81 (BP Location: Left Arm, Patient Position: Sitting, Cuff Size: Normal)   Pulse 81   Ht 5\' 8"  (1.727 m)   Wt 168 lb 8 oz (76.4 kg)   SpO2 97%   BMI 25.62 kg/m    Physical Exam Vitals and nursing note reviewed.  Constitutional:      General: He is not in acute distress.    Appearance: Normal appearance.  HENT:     Head: Normocephalic and atraumatic.     Right Ear: Tympanic membrane, ear canal and external ear normal.     Left Ear: Tympanic membrane, ear canal and external ear normal.     Nose: Nose normal.     Mouth/Throat:     Pharynx: Posterior oropharyngeal erythema present. No oropharyngeal exudate.  Eyes:     Conjunctiva/sclera: Conjunctivae normal.  Cardiovascular:     Rate and Rhythm: Normal rate and regular rhythm.  Pulmonary:     Effort: Pulmonary effort is normal.     Breath sounds: Normal breath sounds.  Neurological:     General: No focal deficit present.     Mental Status: He is alert and oriented to person, place, and time.  Psychiatric:        Mood and Affect: Mood normal.        Behavior: Behavior normal.        Thought Content: Thought content normal.        Judgment: Judgment normal.     No results found for any  visits on 10/09/23.      Assessment & Plan:   Problem List Items Addressed This Visit       Respiratory   Viral pharyngitis - Primary    Sore throat is main complaint of patient. Rapid strep is negative. On exam throat is erythematous. No exudate seen. Will treat with medrol dose pack to help decrease inflammation. Rapid covid negative - recommend warm tea with honey       Relevant Medications   methylPREDNISolone (MEDROL DOSEPAK) 4 MG TBPK tablet    Meds ordered this encounter  Medications   methylPREDNISolone (MEDROL DOSEPAK) 4 MG TBPK tablet    Sig: Follow instructions on pill pack    Dispense:  21 tablet    Refill:  0    Return if symptoms worsen or fail to improve with pcp.  Charlton Amor, DO

## 2023-10-09 NOTE — Assessment & Plan Note (Addendum)
Sore throat is main complaint of patient. Rapid strep is negative. On exam throat is erythematous. No exudate seen. Will treat with medrol dose pack to help decrease inflammation. Rapid covid negative - recommend warm tea with honey

## 2023-11-09 ENCOUNTER — Ambulatory Visit: Payer: BC Managed Care – PPO | Admitting: Family Medicine

## 2023-11-09 ENCOUNTER — Encounter: Payer: Self-pay | Admitting: Family Medicine

## 2023-11-09 VITALS — BP 115/77 | HR 96 | Ht 68.0 in | Wt 170.0 lb

## 2023-11-09 DIAGNOSIS — K219 Gastro-esophageal reflux disease without esophagitis: Secondary | ICD-10-CM | POA: Diagnosis not present

## 2023-11-09 MED ORDER — PANTOPRAZOLE SODIUM 40 MG PO TBEC: 40.0000 mg | DELAYED_RELEASE_TABLET | Freq: Every day | ORAL | 3 refills | Status: DC

## 2023-11-09 NOTE — Patient Instructions (Signed)
Enfermedad de reflujo gastroesofgico en los adultos Gastroesophageal Reflux Disease, Adult  El reflujo gastroesofgico (RGE) ocurre cuando el cido del estmago sube por el tubo que conecta la boca con el estmago (esfago). Normalmente, la comida baja por el esfago y se mantiene en el [address], donde se la digiere. Cuando una persona tiene RGE, los alimentos y el cido estomacal suelen volver al esfago. Usted puede tener una enfermedad llamada enfermedad de reflujo gastroesofgico (ERGE) si el reflujo: Sucede a menudo. Causa sntomas frecuentes o muy intensos. Causa problemas tales como dao en el esfago. Cuando esto ocurre, el esfago duele y se hincha. Con el tiempo, la ERGE puede ocasionar pequeos agujeros (lceras) en el revestimiento del esfago. Cules son las causas? Esta afeccin se debe a un problema en el msculo que se encuentra entre el esfago y [place]. Cuando este msculo est dbil o no es normal, no se cierra correctamente para impedir que los alimentos y el cido regresen del Teaching laboratory technician. El msculo puede debilitarse debido a lo siguiente: El consumo de [place]. Tener cierto tipo de hernia (hernia de hiato). Consumo de alcohol. Ciertos alimentos y bebidas, como caf, chocolate, cebollas y [place]. Qu incrementa el riesgo? Tener sobrepeso. Tener una enfermedad que afecta el tejido conjuntivo. Tomar antiinflamatorios no esteroideos (AINE), como el ibuprofeno. Cules son los signos o sntomas? Acidez estomacal. Dificultad o dolor al tragar. Sensacin de Warehouse manager un bulto en la garganta. Sabor amargo en la boca. Mal aliento. Tener una gran cantidad de saliva. Estmago inflamado o con Dentist. Eructos. Dolor en el pecho. El dolor de pecho puede deberse a distintas afecciones. Asegrese de Science writer a su mdico si tiene Journalist, newspaper. Dificultad para respirar o sibilancias. Neomia Dear tos a largo plazo o tos nocturna. Desgaste de la superficie de los dientes  (esmalte dental). Prdida de peso. Cmo se trata? Realizar cambios en la dieta. Tomar medicamentos. Someterse a [place]. El tratamiento depender de la gravedad de los sntomas. Siga estas instrucciones en su casa: Comida y bebida  Siga una dieta como se lo haya indicado el mdico. Es posible que deba evitar alimentos y bebidas, por ejemplo: Caf y t negro, con o sin cafena. Bebidas que contengan alcohol. Bebidas energticas y deportivas. Bebidas gaseosas (carbonatadas) y refrescos. Chocolate y cacao. Menta y esencia de [place]. Ajo y cebolla. Rbano picante. Alimentos cidos y condimentados. Estos incluyen todos los tipos de pimientos, [place] en polvo, curry en polvo, vinagre, salsas picantes y [employer]. Ctricos y sus jugos, por ejemplo, naranjas, limones y limas. Alimentos que [employer]. Estos incluyen salsa roja, [place], salsa picante y pizza con salsa de [place]. Alimentos fritos y Lexicographer. Estos incluyen donas, papas fritas, papitas fritas de bolsa y aderezos con alto contenido de [place] Carnes con alto contenido de [place] Estas incluye los perros calientes, chuletas o costillas, embutidos, jamn y tocino. Productos lcteos ricos en grasas, como leche [place], [place]na y [place] crema. Consuma pequeas cantidades de comida con ms frecuencia. Evite consumir porciones abundantes. Evite beber grandes cantidades de lquidos con las comidas. Evite comer 2 o 3 horas antes de acostarse. Evite recostarse inmediatamente despus de comer. No haga ejercicios enseguida despus de comer. Estilo de vida  No fume ni consuma ningn producto que contenga nicotina o tabaco. Si necesita ayuda para dejar de consumir estos productos, consulte al mdico. Intente reducir el nivel de estrs. Si necesita ayuda para hacer esto, consulte al mdico. Si tiene sobrepeso, baje una cantidad de peso saludable para usted. Consulte a  su mdico para bajar de peso de [place] segura. Instrucciones  generales Est atento a cualquier cambio en los sntomas. Tome los medicamentos de venta libre y los recetados solamente como se lo haya indicado el mdico. No tome aspirina, ibuprofeno ni otros antiinflamatorios no esteroideos (AINE) a menos que el mdico lo autorice. Use ropa holgada. No use nada apretado alrededor de la cintura. Levante (eleve) la cabecera de la cama aproximadamente 6 pulgadas (15 cm). Para hacerlo, es posible que tenga que utilizar una cua. Evite inclinarse si al hacerlo empeoran los sntomas. Cumpla con todas las visitas de seguimiento. Comunquese con un mdico si: Aparecen nuevos sntomas. Adelgaza y no sabe por qu. Tiene problemas para tragar o le duele cuando traga. Tiene sibilancias o tos persistente. Tiene la voz ronca. Los sntomas no mejoran con Scientist, research (medical). Solicite ayuda de inmediato si: Electronics engineer repentino [employer], el cuello, la [place], los dientes o la espalda. De repente se siente transpirado, mareado o aturdido. Siente falta de aire o Journalist, newspaper. Vomita y el vmito es de color verde, amarillo o negro, o tiene un aspecto similar a la sangre o a los posos de caf. Se desmaya. Las heces (deposiciones) son rojas, sanguinolentas o negras. No puede tragar, beber o comer. Estos sntomas pueden representar un problema grave que constituye Radio broadcast assistant. No espere a ver si los sntomas desaparecen. Solicite atencin mdica de inmediato. Comunquese con el servicio de emergencias de su localidad (911 en los [place]). No conduzca por sus propios medios [employer]. Resumen Si una persona tiene enfermedad de reflujo gastroesofgico (ERGE), los alimentos y el cido estomacal suben al esfago y causan sntomas o problemas tales como dao en el esfago. El tratamiento depender de la gravedad de los sntomas. Siga una dieta como se lo haya indicado el mdico. Use todos los medicamentos solamente como se lo haya indicado el  mdico. Esta informacin no tiene Theme park manager el consejo del mdico. Asegrese de hacerle al mdico cualquier pregunta que tenga. Document Revised: 07/20/2020 Document Reviewed: 07/20/2020 Elsevier Patient Education  2024 [employer].

## 2023-11-09 NOTE — Assessment & Plan Note (Signed)
Dietary modifications discussed.  Start pantoprazole 40mg  daily.  F/u in 1 month.

## 2023-11-09 NOTE — Progress Notes (Signed)
Casey Bowers - 37 y.o. male MRN [id number]  Date of birth: [birth year]  Subjective Chief Complaint  Patient presents with   Gastroesophageal Reflux   Snoring    HPI Casey Bowers is a 37 y.o. male here today with complaint of waking up in the evening gasping.  Notes acid reflux as cause of his symptoms.  This is occurring nearly every night.  He has tried nothing so far.  Denies nausea.  He has noted that certain foods do trigger his symptoms including acidic and spicy foods.  Denies excess a EtOH or NSAID use.    ROS:  A comprehensive ROS was completed and negative except as noted per HPI  No Known Allergies  Past Medical History:  Diagnosis Date   Hyperlipidemia     History reviewed. No pertinent surgical history.  Social History   Socioeconomic History   Marital status: Single    Spouse name: Not on file   Number of children: Not on file   Years of education: Not on file   Highest education level: Not on file  Occupational History   Not on file  Tobacco Use   Smoking status: Never   Smokeless tobacco: Never  Substance and Sexual Activity   Alcohol use: Yes    Alcohol/week: 0.0 standard drinks of alcohol   Drug use: No   Sexual activity: Yes  Other Topics Concern   Not on file  Social History Narrative   Not on file   Social Determinants of Health   Financial Resource Strain: Not on file  Food Insecurity: Not on file  Transportation Needs: Not on file  Physical Activity: Not on file  Stress: Not on file  Social Connections: Unknown (05/02/2022)   Received from [facility], [facility]   Social Network    Social Network: Not on file    History reviewed. No pertinent family history.  Health Maintenance  Topic Date Due   COVID-19 Vaccine (3 - 2023-24 season) 08/23/2023   INFLUENZA VACCINE  03/21/2024 (Originally 07/23/2023)   Hepatitis C Screening  04/19/2024 (Originally 09/25/2004)   DTaP/Tdap/Td (3 - Td or Tdap) 02/11/2026   HIV  Screening  Completed   HPV VACCINES  Aged Out     ----------------------------------------------------------------------------------------------------------------------------------------------------------------------------------------------------------------- Physical Exam BP 115/77 (BP Location: Left Arm, Patient Position: Sitting, Cuff Size: Normal)   Pulse 96   Ht 5\' 8"  (1.727 m)   Wt 170 lb (77.1 kg)   SpO2 95%   BMI 25.85 kg/m   Physical Exam Constitutional:      Appearance: Normal appearance.  HENT:     Head: Normocephalic and atraumatic.  Eyes:     General: No scleral icterus. Cardiovascular:     Rate and Rhythm: Normal rate and regular rhythm.  Pulmonary:     Effort: Pulmonary effort is normal.     Breath sounds: Normal breath sounds.  Musculoskeletal:     Cervical back: Neck supple.  Neurological:     Mental Status: He is alert.  Psychiatric:        Mood and Affect: Mood normal.        Behavior: Behavior normal.     ------------------------------------------------------------------------------------------------------------------------------------------------------------------------------------------------------------------- Assessment and Plan  GERD (gastroesophageal reflux disease) Dietary modifications discussed.  Start pantoprazole 40mg  daily.  F/u in 1 month.    Meds ordered this encounter  Medications   pantoprazole (PROTONIX) 40 MG tablet    Sig: Take 1 tablet (40 mg total) by mouth daily.    Dispense:  30 tablet  Refill:  3    Return in about 4 weeks (around 12/07/2023) for GERD.    This visit occurred during the SARS-CoV-2 public health emergency.  Safety protocols were in place, including screening questions prior to the visit, additional usage of staff PPE, and extensive cleaning of exam room while observing appropriate contact time as indicated for disinfecting solutions.

## 2023-12-07 ENCOUNTER — Encounter: Payer: Self-pay | Admitting: Family Medicine

## 2023-12-07 ENCOUNTER — Ambulatory Visit: Payer: BC Managed Care – PPO | Admitting: Family Medicine

## 2023-12-07 VITALS — BP 119/77 | HR 68 | Temp 98.3°F | Ht 67.0 in | Wt 169.1 lb

## 2023-12-07 DIAGNOSIS — Z72820 Sleep deprivation: Secondary | ICD-10-CM | POA: Insufficient documentation

## 2023-12-07 DIAGNOSIS — K219 Gastro-esophageal reflux disease without esophagitis: Secondary | ICD-10-CM

## 2023-12-07 DIAGNOSIS — R0683 Snoring: Secondary | ICD-10-CM | POA: Diagnosis not present

## 2023-12-07 MED ORDER — PANTOPRAZOLE SODIUM 40 MG PO TBEC: 40.0000 mg | DELAYED_RELEASE_TABLET | Freq: Two times a day (BID) | ORAL | 3 refills | Status: DC

## 2023-12-07 NOTE — Assessment & Plan Note (Signed)
Increase pantoprazole to BID dosing.  Referral placed to GI as well.

## 2023-12-07 NOTE — Patient Instructions (Signed)
Take pantoprazole twice per day.  Referral placed to Gastroenterology and Sleep specialist.

## 2023-12-07 NOTE — Assessment & Plan Note (Signed)
Poor sleep with heavy snoring and uncontrolled reflux are concerning for OSA.  Referral for sleep study.

## 2023-12-07 NOTE — Progress Notes (Signed)
Casey Bowers - 37 y.o. male MRN [id number]  Date of birth: 1986-04-13  Subjective Chief Complaint  Patient presents with   Gastroesophageal Reflux    HPI Casey Bowers is a 37 y.o. male here today for follow up of GERD.  Protonix added at last visit.  Reports that he has not really had a whole lot of improvement since adding this.  Denies nausea.   He also reports poor sleep with snoring and gasping in the in middle of the night. Concerns about OSA.   ROS:  A comprehensive ROS was completed and negative except as noted per HPI   No Known Allergies  Past Medical History:  Diagnosis Date   Hyperlipidemia     No past surgical history on file.  Social History   Socioeconomic History   Marital status: Single    Spouse name: Not on file   Number of children: Not on file   Years of education: Not on file   Highest education level: Not on file  Occupational History   Not on file  Tobacco Use   Smoking status: Never   Smokeless tobacco: Never  Substance and Sexual Activity   Alcohol use: Yes    Alcohol/week: 0.0 standard drinks of alcohol   Drug use: No   Sexual activity: Yes  Other Topics Concern   Not on file  Social History Narrative   Not on file   Social Drivers of Health   Financial Resource Strain: Not on file  Food Insecurity: Not on file  Transportation Needs: Not on file  Physical Activity: Not on file  Stress: Not on file  Social Connections: Unknown (05/02/2022)   Received from [facility], [facility]   Social Network    Social Network: Not on file    No family history on file.  Health Maintenance  Topic Date Due   COVID-19 Vaccine (3 - 2024-25 season) 12/23/2023 (Originally 08/23/2023)   INFLUENZA VACCINE  03/21/2024 (Originally 07/23/2023)   Hepatitis C Screening  04/19/2024 (Originally 09/25/2004)   DTaP/Tdap/Td (3 - Td or Tdap) 02/11/2026   HIV Screening  Completed   HPV VACCINES  Aged Out      ----------------------------------------------------------------------------------------------------------------------------------------------------------------------------------------------------------------- Physical Exam BP 119/77 (BP Location: Left Arm, Patient Position: Sitting, Cuff Size: Normal)   Pulse 68   Temp 98.3 F (36.8 C) (Oral)   Ht 5\' 7"  (1.702 m)   Wt 169 lb 1.9 oz (76.7 kg)   SpO2 98%   BMI 26.49 kg/m   Physical Exam Constitutional:      Appearance: Normal appearance.  Cardiovascular:     Rate and Rhythm: Normal rate and regular rhythm.  Abdominal:     General: There is no distension.     Palpations: Abdomen is soft.     Tenderness: There is no abdominal tenderness.  Neurological:     Mental Status: He is alert.  Psychiatric:        Mood and Affect: Mood normal.        Behavior: Behavior normal.     ------------------------------------------------------------------------------------------------------------------------------------------------------------------------------------------------------------------- Assessment and Plan  GERD (gastroesophageal reflux disease) Increase pantoprazole to BID dosing.  Referral placed to GI as well.   Poor sleep Poor sleep with heavy snoring and uncontrolled reflux are concerning for OSA.  Referral for sleep study.    No orders of the defined types were placed in this encounter.   No follow-ups on file.    This visit occurred during the SARS-CoV-2 public health emergency.  Safety protocols were in  place, including screening questions prior to the visit, additional usage of staff PPE, and extensive cleaning of exam room while observing appropriate contact time as indicated for disinfecting solutions.

## 2024-01-12 ENCOUNTER — Other Ambulatory Visit (INDEPENDENT_AMBULATORY_CARE_PROVIDER_SITE_OTHER): Payer: BC Managed Care – PPO

## 2024-01-12 ENCOUNTER — Encounter: Payer: Self-pay | Admitting: Gastroenterology

## 2024-01-12 ENCOUNTER — Ambulatory Visit: Payer: BC Managed Care – PPO | Admitting: Gastroenterology

## 2024-01-12 VITALS — BP 110/72 | HR 86 | Ht 67.0 in | Wt 168.5 lb

## 2024-01-12 DIAGNOSIS — R1110 Vomiting, unspecified: Secondary | ICD-10-CM

## 2024-01-12 DIAGNOSIS — R112 Nausea with vomiting, unspecified: Secondary | ICD-10-CM | POA: Diagnosis not present

## 2024-01-12 DIAGNOSIS — K219 Gastro-esophageal reflux disease without esophagitis: Secondary | ICD-10-CM

## 2024-01-12 DIAGNOSIS — R059 Cough, unspecified: Secondary | ICD-10-CM | POA: Diagnosis not present

## 2024-01-12 LAB — CBC WITH DIFFERENTIAL/PLATELET
Basophils Absolute: 0.1 10*3/uL (ref 0.0–0.1)
Basophils Relative: 0.6 % (ref 0.0–3.0)
Eosinophils Absolute: 0.2 10*3/uL (ref 0.0–0.7)
Eosinophils Relative: 1.4 % (ref 0.0–5.0)
HCT: 46.3 % (ref 39.0–52.0)
Hemoglobin: 15.5 g/dL (ref 13.0–17.0)
Lymphocytes Relative: 15.7 % (ref 12.0–46.0)
Lymphs Abs: 2 10*3/uL (ref 0.7–4.0)
MCHC: 33.5 g/dL (ref 30.0–36.0)
MCV: 92.8 fL (ref 78.0–100.0)
Monocytes Absolute: 0.8 10*3/uL (ref 0.1–1.0)
Monocytes Relative: 6.2 % (ref 3.0–12.0)
Neutro Abs: 9.5 10*3/uL — ABNORMAL HIGH (ref 1.4–7.7)
Neutrophils Relative %: 76.1 % (ref 43.0–77.0)
Platelets: 247 10*3/uL (ref 150.0–400.0)
RBC: 4.99 Mil/uL (ref 4.22–5.81)
RDW: 12.8 % (ref 11.5–15.5)
WBC: 12.5 10*3/uL — ABNORMAL HIGH (ref 4.0–10.5)

## 2024-01-12 LAB — COMPREHENSIVE METABOLIC PANEL
ALT: 21 U/L (ref 0–53)
AST: 18 U/L (ref 0–37)
Albumin: 4.5 g/dL (ref 3.5–5.2)
Alkaline Phosphatase: 101 U/L (ref 39–117)
BUN: 18 mg/dL (ref 6–23)
CO2: 29 meq/L (ref 19–32)
Calcium: 9.3 mg/dL (ref 8.4–10.5)
Chloride: 104 meq/L (ref 96–112)
Creatinine, Ser: 0.85 mg/dL (ref 0.40–1.50)
GFR: 111.17 mL/min (ref >60.00)
Glucose, Bld: 120 mg/dL — ABNORMAL HIGH (ref 70–99)
Potassium: 3.9 meq/L (ref 3.5–5.1)
Sodium: 140 meq/L (ref 135–145)
Total Bilirubin: 0.5 mg/dL (ref 0.2–1.2)
Total Protein: 7.2 g/dL (ref 6.0–8.3)

## 2024-01-12 MED ORDER — FAMOTIDINE 40 MG PO TABS: 40.0000 mg | ORAL_TABLET | Freq: Every day | ORAL | 1 refills | Status: DC

## 2024-01-12 NOTE — Patient Instructions (Addendum)
You have been scheduled for an endoscopy. Please follow written instructions given to you at your visit today.  If you use inhalers (even only as needed), please bring them with you on the day of your procedure. ____________________________________________ Your provider has requested that you go to the basement level for lab work before leaving today. Press "B" on the elevator. The lab is located at the first door on the left as you exit the elevator.  Due to recent changes in healthcare laws, you may see the results of your imaging and laboratory studies on MyChart before your provider has had a chance to review them.  We understand that in some cases there may be results that are confusing or concerning to you. Not all laboratory results come back in the same time frame and the provider may be waiting for multiple results in order to interpret others.  Please give [place] 48 hours in order for your provider to thoroughly review all the results before contacting the office for clarification of your results.   Thank you for trusting me with your gastrointestinal care!   Boone Master, PA

## 2024-01-12 NOTE — Progress Notes (Signed)
Chief Complaint: Upper GI symptoms Primary GI MD: Gentry Fitz  HPI: 38 year old male with PMH as listed below presents for evaluation of GERD  Discussed the use of AI scribe software for clinical note transcription with the patient, who gave verbal consent to proceed.  The patient presents with a chief complaint of nocturnal gastroesophageal reflux disease (GERD) symptoms, including regurgitation, coughing, and occasional vomiting. These symptoms have been occurring most nights for the past six months to a year, with some weeks being worse than others. The patient reports that the reflux is not present during the day and does not typically eat soon before lying down. The symptoms are exacerbated by spicy and acidic foods.  The patient was previously prescribed Protonix 40mg  twice daily, which was started on December 16th. However, due to an emergency trip to [place], the patient was unable to take the medication for three weeks. Upon return, the patient resumed the medication but reports no significant improvement in symptoms.  The patient occasionally takes Aleve for headaches but denies daily use. Alcohol consumption is reported as social and infrequent. The patient denies any changes in bowel habits, rectal bleeding, or black stools.  The patient has noticed an increase in the frequency and severity of the reflux symptoms over the past two years. On some occasions, the reflux has been so severe that it causes shortness of breath and panic. The patient has attempted to alleviate symptoms by using a larger pillow for elevation during sleep and has noticed a slight improvement. However, the patient reports that even water can cause regurgitation.  The patient denies any use of blood thinners.      Past Medical History:  Diagnosis Date   Hyperlipidemia     History reviewed. No pertinent surgical history.  Current Outpatient Medications  Medication Sig Dispense Refill   famotidine  (PEPCID) 40 MG tablet Take 1 tablet (40 mg total) by mouth at bedtime. 30 tablet 1   pantoprazole (PROTONIX) 40 MG tablet Take 1 tablet (40 mg total) by mouth 2 (two) times daily. 60 tablet 3   terbinafine (LAMISIL) 1 % cream Apply 1 Application topically 2 (two) times daily. (Patient not taking: Reported on 11/09/2023) 42 g 1   No current facility-administered medications for this visit.    Allergies as of 01/12/2024   (No Known Allergies)    History reviewed. No pertinent family history.  Social History   Socioeconomic History   Marital status: Single    Spouse name: Not on file   Number of children: Not on file   Years of education: Not on file   Highest education level: Not on file  Occupational History   Not on file  Tobacco Use   Smoking status: Never   Smokeless tobacco: Never  Vaping Use   Vaping status: Never Used  Substance and Sexual Activity   Alcohol use: Yes    Alcohol/week: 0.0 standard drinks of alcohol   Drug use: No   Sexual activity: Yes  Other Topics Concern   Not on file  Social History Narrative   Not on file   Social Drivers of Health   Financial Resource Strain: Not on file  Food Insecurity: Not on file  Transportation Needs: Not on file  Physical Activity: Not on file  Stress: Not on file  Social Connections: Unknown (05/02/2022)   Received from [facility], [facility]   Social Network    Social Network: Not on file  Intimate Partner Violence: Unknown (03/26/2022)  Received from [facility], [facility]   HITS    Physically Hurt: Not on file    Insult or Talk Down To: Not on file    Threaten Physical Harm: Not on file    Scream or Curse: Not on file    Review of Systems:    Constitutional: No weight loss, fever, chills, weakness or fatigue HEENT: Eyes: No change in vision               Ears, Nose, Throat:  No change in hearing or congestion Skin: No rash or itching Cardiovascular: No chest pain, chest pressure or  palpitations   Respiratory: No SOB or cough Gastrointestinal: See HPI and otherwise negative Genitourinary: No dysuria or change in urinary frequency Neurological: No headache, dizziness or syncope Musculoskeletal: No new muscle or joint pain Hematologic: No bleeding or bruising Psychiatric: No history of depression or anxiety    Physical Exam:  Vital signs: BP 110/72 (BP Location: Left Arm, Patient Position: Sitting, Cuff Size: Normal)   Pulse 86   Ht 5\' 7"  (1.702 m)   Wt 168 lb 8 oz (76.4 kg)   SpO2 96%   BMI 26.39 kg/m   Constitutional: NAD, Well developed, Well nourished, alert and cooperative Head:  Normocephalic and atraumatic. Eyes:   PEERL, EOMI. No icterus. Conjunctiva pink. Respiratory: Respirations even and unlabored. Lungs clear to auscultation bilaterally.   No wheezes, crackles, or rhonchi.  Cardiovascular:  Regular rate and rhythm. No peripheral edema, cyanosis or pallor.  Gastrointestinal:  Soft, nondistended, nontender. No rebound or guarding. Normal bowel sounds. No appreciable masses or hepatomegaly. Rectal:  Not performed.  Msk:  Symmetrical without gross deformities. Without edema, no deformity or joint abnormality.  Neurologic:  Alert and  oriented x4;  grossly normal neurologically.  Skin:   Dry and intact without significant lesions or rashes. Psychiatric: Oriented to person, place and time. Demonstrates good judgement and reason without abnormal affect or behaviors.   RELEVANT LABS AND IMAGING: CBC    Component Value Date/Time   WBC 5.3 04/08/2022 0000   RBC 5.02 04/08/2022 0000   HGB 15.7 04/08/2022 0000   HCT 46.1 04/08/2022 0000   PLT 225 04/08/2022 0000   MCV 91.8 04/08/2022 0000   MCH 31.3 04/08/2022 0000   MCHC 34.1 04/08/2022 0000   RDW 12.3 04/08/2022 0000   LYMPHSABS 1,940 04/08/2022 0000   EOSABS 101 04/08/2022 0000   BASOSABS 42 04/08/2022 0000    CMP     Component Value Date/Time   NA 140 04/08/2022 0000   K 4.4 04/08/2022  0000   CL 104 04/08/2022 0000   CO2 24 04/08/2022 0000   GLUCOSE 95 04/08/2022 0000   BUN 15 04/08/2022 0000   CREATININE 0.80 04/08/2022 0000   CALCIUM 9.0 04/08/2022 0000   PROT 6.8 04/08/2022 0000   ALBUMIN 4.4 02/12/2016 1026   AST 20 04/08/2022 0000   ALT 21 04/08/2022 0000   ALKPHOS 84 02/12/2016 1026   BILITOT 0.6 04/08/2022 0000   GFRNONAA 122 04/02/2021 0000   GFRAA 142 04/02/2021 0000     Assessment/Plan:      Gastroesophageal Reflux Disease (GERD)  Regurgitation Nausea/Vomiting Cough Nightly symptoms of regurgitation, heartburn, and occasional vomiting. Symptoms have been worsening over the past year. No relief with Protonix 40mg  twice daily, which was inconsistently taken due to travel. No daytime symptoms. Symptoms are worse with spicy and acidic foods.   -- EGD to evaluate for esophagitis, gastritis, PUD and H. Pylori --  I thoroughly discussed the procedure with the patient (at bedside) to include nature of the procedure, alternatives, benefits, and risks (including but not limited to bleeding, infection, perforation, anesthesia/cardiac pulmonary complications).  Patient verbalized understanding and gave verbal consent to proceed with procedure.   --Start Famotidine 40mg  at bedtime.   --Advise lifestyle modifications including avoiding spicy and acidic foods, and elevating the head of the bed during sleep.   -- Provided patient education handouts -- Advise against NSAID use.   -- CMP, CBC -- Follow up based on EGD results  General Health Maintenance   -Order labs to check kidney function, as he has not been checked since 2023.      Assigned to Dr. [name] today  Boone Master, PA-C [facility] 01/12/2024, 9:58 AM  Cc: Everrett Coombe, DO

## 2024-01-12 NOTE — Progress Notes (Signed)
I agree with the assessment and plan as outlined by Ms. McMichael. 

## 2024-01-17 ENCOUNTER — Encounter: Payer: Self-pay | Admitting: Certified Registered Nurse Anesthetist

## 2024-01-20 ENCOUNTER — Telehealth: Payer: Self-pay | Admitting: Pharmacy Technician

## 2024-01-20 ENCOUNTER — Other Ambulatory Visit (HOSPITAL_COMMUNITY): Payer: Self-pay

## 2024-01-20 ENCOUNTER — Ambulatory Visit: Payer: BC Managed Care – PPO | Admitting: Internal Medicine

## 2024-01-20 ENCOUNTER — Encounter: Payer: Self-pay | Admitting: Internal Medicine

## 2024-01-20 VITALS — BP 123/76 | HR 65 | Temp 98.3°F | Resp 16 | Ht 67.0 in | Wt 168.0 lb

## 2024-01-20 DIAGNOSIS — K2980 Duodenitis without bleeding: Secondary | ICD-10-CM | POA: Diagnosis not present

## 2024-01-20 DIAGNOSIS — K209 Esophagitis, unspecified without bleeding: Secondary | ICD-10-CM

## 2024-01-20 DIAGNOSIS — K3189 Other diseases of stomach and duodenum: Secondary | ICD-10-CM | POA: Diagnosis not present

## 2024-01-20 DIAGNOSIS — K2100 Gastro-esophageal reflux disease with esophagitis, without bleeding: Secondary | ICD-10-CM

## 2024-01-20 DIAGNOSIS — K208 Other esophagitis without bleeding: Secondary | ICD-10-CM | POA: Diagnosis not present

## 2024-01-20 DIAGNOSIS — K219 Gastro-esophageal reflux disease without esophagitis: Secondary | ICD-10-CM

## 2024-01-20 MED ORDER — SODIUM CHLORIDE 0.9 % IV SOLN: 500.0000 mL | Freq: Once | INTRAVENOUS | Status: DC

## 2024-01-20 MED ORDER — SUCRALFATE 1 GM/10ML PO SUSP: 1.0000 g | Freq: Four times a day (QID) | ORAL | 1 refills | Status: DC

## 2024-01-20 NOTE — Progress Notes (Signed)
Called to room to assist during endoscopic procedure.  Patient ID and intended procedure confirmed with present staff. Received instructions for my participation in the procedure from the performing physician.

## 2024-01-20 NOTE — Progress Notes (Signed)
1300 Robinul 0.1 mg IV given due large amount of secretions upon assessment.  MD made aware, vss

## 2024-01-20 NOTE — Progress Notes (Signed)
1348  Pt experienced laryngeal spasm with jaw thrust  performed. Nasopharyngeal airway size 7.0  placed without trauma, vss

## 2024-01-20 NOTE — Op Note (Signed)
[facility] Patient Name: Casey Bowers Procedure Date: 01/20/2024 1:07 PM MRN: [id number] Endoscopist: Madelyn Brunner Gasquet , , [id number] Age: 38 Referring MD:  Date of Birth: June 10, 1986 Gender: Male Account #: [id number] Procedure:                Upper GI endoscopy Indications:              Epigastric abdominal pain, Heartburn Medicines:                Monitored Anesthesia Care Procedure:                Pre-Anesthesia Assessment:                           - Prior to the procedure, a History and Physical                            was performed, and patient medications and                            allergies were reviewed. The patient's tolerance of                            previous anesthesia was also reviewed. The risks                            and benefits of the procedure and the sedation                            options and risks were discussed with the patient.                            All questions were answered, and informed consent                            was obtained. Prior Anticoagulants: The patient has                            taken no anticoagulant or antiplatelet agents. ASA                            Grade Assessment: II - A patient with mild systemic                            disease. After reviewing the risks and benefits,                            the patient was deemed in satisfactory condition to                            undergo the procedure.                           After obtaining informed consent, the endoscope was  passed under direct vision. Throughout the                            procedure, the patient's blood pressure, pulse, and                            oxygen saturations were monitored continuously. The                            GIF [id number] #[id number] was introduced through the                            mouth, and advanced to the second part of duodenum.                            The  upper GI endoscopy was accomplished without                            difficulty. The patient tolerated the procedure                            well. Scope In: Scope Out: Findings:                 LA Grade A (one or more mucosal breaks less than 5                            mm, not extending between tops of 2 mucosal folds)                            esophagitis with no bleeding was found in the                            distal esophagus. Biopsies were taken with a cold                            forceps for histology.                           Localized mildly erythematous mucosa without                            bleeding was found in the gastric antrum. Biopsies                            were taken with a cold forceps for histology.                           Localized mild inflammation characterized by                            congestion (edema) and erythema was found in the  duodenal bulb. Complications:            No immediate complications. Estimated Blood Loss:     Estimated blood loss was minimal. Impression:               - LA Grade A esophagitis with no bleeding. Biopsied.                           - Erythematous mucosa in the antrum. Biopsied.                           - Duodenitis. Recommendation:           - Discharge patient to home (with escort).                           - Await pathology results.                           - Start sucralfate suspension 1 g QID for 4 weeks.                           - Continue pantoprazole 40 mg BID. Please ensure                            that you taking this 30 min to 1 hour before eating.                           - Return to GI clinic in 2-3 months.                           - The findings and recommendations were discussed                            with the patient. Dr Particia Lather "[name]" [name],  01/20/2024 1:57:46 PM

## 2024-01-20 NOTE — Progress Notes (Signed)
Report given to PACU, vss

## 2024-01-20 NOTE — Progress Notes (Signed)
GASTROENTEROLOGY PROCEDURE H&P NOTE   Primary Care Physician: Everrett Coombe, DO    Reason for Procedure:   GERD, N&V  Plan:    EGD  Patient is appropriate for endoscopic procedure(s) in the ambulatory ([facility]) setting.  The nature of the procedure, as well as the risks, benefits, and alternatives were carefully and thoroughly reviewed with the patient. Ample time for discussion and questions allowed. The patient understood, was satisfied, and agreed to proceed.     HPI: Casey Bowers is a 38 y.o. male who presents for EGD for evaluation of GERD, N&V .  Patient was most recently seen in the Gastroenterology Clinic on 01/12/24.  No interval change in medical history since that appointment. Please refer to that note for full details regarding GI history and clinical presentation.   Past Medical History:  Diagnosis Date   Hyperlipidemia     No past surgical history on file.  Prior to Admission medications   Medication Sig Start Date End Date Taking? Authorizing Provider  pantoprazole (PROTONIX) 40 MG tablet Take 1 tablet (40 mg total) by mouth 2 (two) times daily. 12/07/23  Yes Everrett Coombe, DO  famotidine (PEPCID) 40 MG tablet Take 1 tablet (40 mg total) by mouth at bedtime. 01/12/24   McMichael, Saddie Benders, PA-C  terbinafine (LAMISIL) 1 % cream Apply 1 Application topically 2 (two) times daily. Patient not taking: Reported on 01/20/2024 06/23/23   Agapito Games, MD    Current Outpatient Medications  Medication Sig Dispense Refill   pantoprazole (PROTONIX) 40 MG tablet Take 1 tablet (40 mg total) by mouth 2 (two) times daily. 60 tablet 3   famotidine (PEPCID) 40 MG tablet Take 1 tablet (40 mg total) by mouth at bedtime. 30 tablet 1   terbinafine (LAMISIL) 1 % cream Apply 1 Application topically 2 (two) times daily. (Patient not taking: Reported on 01/20/2024) 42 g 1   Current Facility-Administered Medications  Medication Dose Route Frequency Provider Last Rate  Last Admin   0.9 %  sodium chloride infusion  500 mL Intravenous Once [name], MD        Allergies as of 01/20/2024   (No Known Allergies)    No family history on file.  Social History   Socioeconomic History   Marital status: Single    Spouse name: Not on file   Number of children: Not on file   Years of education: Not on file   Highest education level: Not on file  Occupational History   Not on file  Tobacco Use   Smoking status: Never   Smokeless tobacco: Never  Vaping Use   Vaping status: Never Used  Substance and Sexual Activity   Alcohol use: Yes    Alcohol/week: 0.0 standard drinks of alcohol   Drug use: No   Sexual activity: Yes  Other Topics Concern   Not on file  Social History Narrative   Not on file   Social Drivers of Health   Financial Resource Strain: Not on file  Food Insecurity: Not on file  Transportation Needs: Not on file  Physical Activity: Not on file  Stress: Not on file  Social Connections: Unknown (05/02/2022)   Received from [facility]   Social Network    Social Network: Not on file  Intimate Partner Violence: Unknown (03/26/2022)   Received from [facility], [facility]   HITS    Physically Hurt: Not on file    Insult or Talk Down To: Not on  file    Threaten Physical Harm: Not on file    Scream or Curse: Not on file    Physical Exam: Vital signs in last 24 hours: BP 117/76   Pulse 64   Temp 98.3 F (36.8 C)   Ht 5\' 7"  (1.702 m)   Wt 168 lb (76.2 kg)   SpO2 99%   BMI 26.31 kg/m  GEN: NAD EYE: Sclerae anicteric ENT: MMM CV: Non-tachycardic Pulm: No increased WOB GI: Soft NEURO:  Alert & Oriented   Eulah Pont, MD [facility]   01/20/2024 1:29 PM

## 2024-01-20 NOTE — Patient Instructions (Signed)
-  Handout on  reflux, Duodenitis, esophagitis provided -await pathology results --Continue present medications  Take pantoprazole 40 mg twice a day Please ensure that you are taking this 30 min to 1 hour before eating. Start Sucrlfate 1 gm 4 times a day for 4 weeks   YOU HAD AN ENDOSCOPIC PROCEDURE TODAY AT THE [facility]:   Refer to the procedure report that was given to you for any specific questions about what was found during the examination.  If the procedure report does not answer your questions, please call your gastroenterologist to clarify.  If you requested that your care partner not be given the details of your procedure findings, then the procedure report has been included in a sealed envelope for you to review at your convenience later.  YOU SHOULD EXPECT: Some feelings of bloating in the abdomen. Passage of more gas than usual.  Walking can help get rid of the air that was put into your GI tract during the procedure and reduce the bloating. If you had a lower endoscopy (such as a colonoscopy or flexible sigmoidoscopy) you may notice spotting of blood in your stool or on the toilet paper. If you underwent a bowel prep for your procedure, you may not have a normal bowel movement for a few days.  Please Note:  You might notice some irritation and congestion in your nose or some drainage.  This is from the oxygen used during your procedure.  There is no need for concern and it should clear up in a day or so.  SYMPTOMS TO REPORT IMMEDIATELY:  Following upper endoscopy (EGD)  Vomiting of blood or coffee ground material  New chest pain or pain under the shoulder blades  Painful or persistently difficult swallowing  New shortness of breath  Fever of 100F or higher  Black, tarry-looking stools  For urgent or emergent issues, a gastroenterologist can be reached at any hour by calling [phone]. Do not use MyChart messaging for urgent concerns.    DIET:  We do recommend  a small meal at first, but then you may proceed to your regular diet.  Drink plenty of fluids but you should avoid alcoholic beverages for 24 hours.  ACTIVITY:  You should plan to take it easy for the rest of today and you should NOT DRIVE or use heavy machinery until tomorrow (because of the sedation medicines used during the test).    FOLLOW UP: Our staff will call the number listed on your records the next business day following your procedure.  We will call around 7:15- 8:00 am to check on you and address any questions or concerns that you may have regarding the information given to you following your procedure. If we do not reach you, we will leave a message.     If any biopsies were taken you will be contacted by phone or by letter within the next 1-3 weeks.  Please call us at [phone] if you have not heard about the biopsies in 3 weeks.    SIGNATURES/CONFIDENTIALITY: You and/or your care partner have signed paperwork which will be entered into your electronic medical record.  These signatures attest to the fact that that the information above on your After Visit Summary has been reviewed and is understood.  Full responsibility of the confidentiality of this discharge information lies with you and/or your care-partner.

## 2024-01-20 NOTE — Telephone Encounter (Signed)
Pharmacy Patient Advocate Encounter   Received notification from CoverMyMeds that prior authorization for SUCRALFATE 1GM LIQ is required/requested.   Insurance verification completed.   The patient is insured through [employer] [place]  .   Per test claim: PA required; PA submitted to above mentioned insurance via CoverMyMeds Key/confirmation #/EOC [id number] Status is pending  If insurance does not cover the liquid, they will pay for the tablets at $0 copay. Patient can crush/dissolve tablet in water.

## 2024-01-20 NOTE — Progress Notes (Signed)
Pt's states no medical or surgical changes since previsit or office visit.

## 2024-01-21 ENCOUNTER — Telehealth: Payer: Self-pay

## 2024-01-21 NOTE — Telephone Encounter (Signed)
  Follow up Call-     01/20/2024   12:57 PM  Call back number  Post procedure Call Back phone  # [phone]  Permission to leave phone message Yes     Patient questions:  Do you have a fever, pain , or abdominal swelling? No. Pain Score  0 *  Have you tolerated food without any problems? Yes.    Have you been able to return to your normal activities? Yes.    Do you have any questions about your discharge instructions: Diet   No. Medications  No. Follow up visit  No.  Do you have questions or concerns about your Care? Yes.   Pt reports throat is scratchy. Informed pt to use some chloroseptic spray  or lozenges. Pt verbalizes understanding. Informed to call back if symptoms get worse. Pt verbalizes understanding.   Actions: * If pain score is 4 or above: No action needed, pain <4.

## 2024-01-22 NOTE — Telephone Encounter (Signed)
Pharmacy Patient Advocate Encounter  Received notification from [facility]  that Prior Authorization for SUCRAFATE 1GM has been DENIED.  Full denial letter will be uploaded to the media tab. See denial reason below.   PA #/Case ID/Reference #: [id number]

## 2024-01-25 ENCOUNTER — Other Ambulatory Visit: Payer: Self-pay

## 2024-01-25 DIAGNOSIS — K219 Gastro-esophageal reflux disease without esophagitis: Secondary | ICD-10-CM

## 2024-01-25 DIAGNOSIS — K209 Esophagitis, unspecified without bleeding: Secondary | ICD-10-CM

## 2024-01-25 DIAGNOSIS — K2980 Duodenitis without bleeding: Secondary | ICD-10-CM

## 2024-01-25 MED ORDER — SUCRALFATE 1 G PO TABS: 1.0000 g | ORAL_TABLET | Freq: Four times a day (QID) | ORAL | 0 refills | Status: DC

## 2024-01-25 NOTE — Telephone Encounter (Signed)
Changed to tablets. Pharmacist to instruct to make slurry with tablets.

## 2024-01-26 LAB — SURGICAL PATHOLOGY

## 2024-01-28 ENCOUNTER — Encounter: Payer: Self-pay | Admitting: Internal Medicine

## 2024-02-03 ENCOUNTER — Other Ambulatory Visit: Payer: Self-pay | Admitting: Gastroenterology

## 2024-02-03 DIAGNOSIS — K219 Gastro-esophageal reflux disease without esophagitis: Secondary | ICD-10-CM

## 2024-02-04 ENCOUNTER — Other Ambulatory Visit: Payer: Self-pay | Admitting: Family Medicine

## 2024-02-10 ENCOUNTER — Other Ambulatory Visit: Payer: Self-pay | Admitting: Family Medicine

## 2024-03-22 ENCOUNTER — Encounter: Payer: Self-pay | Admitting: Family Medicine

## 2024-03-22 ENCOUNTER — Ambulatory Visit (INDEPENDENT_AMBULATORY_CARE_PROVIDER_SITE_OTHER): Admitting: Family Medicine

## 2024-03-22 VITALS — BP 113/73 | HR 65 | Ht 67.0 in | Wt 168.0 lb

## 2024-03-22 DIAGNOSIS — Z13220 Encounter for screening for lipoid disorders: Secondary | ICD-10-CM | POA: Diagnosis not present

## 2024-03-22 DIAGNOSIS — R7309 Other abnormal glucose: Secondary | ICD-10-CM

## 2024-03-22 DIAGNOSIS — Z0000 Encounter for general adult medical examination without abnormal findings: Secondary | ICD-10-CM

## 2024-03-22 NOTE — Assessment & Plan Note (Signed)
 Well adult Orders Placed This Encounter  Procedures   CMP14+EGFR   CBC with Differential/Platelet   Lipid Panel With LDL/HDL Ratio   HgB A1c  Screenings: UTD Immunization: UTD Anticipatory guidance/Risk factor reduction:  Recommendations per AVS.

## 2024-03-22 NOTE — Patient Instructions (Signed)
 Preventive Care 38-38 Years Old, Male Preventive care refers to lifestyle choices and visits with your health care provider that can promote health and wellness. Preventive care visits are also called wellness exams. What can I expect for my preventive care visit? Counseling During your preventive care visit, your health care provider may ask about your: Medical history, including: Past medical problems. Family medical history. Current health, including: Emotional well-being. Home life and relationship well-being. Sexual activity. Lifestyle, including: Alcohol, nicotine or tobacco, and drug use. Access to firearms. Diet, exercise, and sleep habits. Safety issues such as seatbelt and bike helmet use. Sunscreen use. Work and work Astronomer. Physical exam Your health care provider may check your: Height and weight. These may be used to calculate your BMI (body mass index). BMI is a measurement that tells if you are at a healthy weight. Waist circumference. This measures the distance around your waistline. This measurement also tells if you are at a healthy weight and may help predict your risk of certain diseases, such as type 2 diabetes and high blood pressure. Heart rate and blood pressure. Body temperature. Skin for abnormal spots. What immunizations do I need?  Vaccines are usually given at various ages, according to a schedule. Your health care provider will recommend vaccines for you based on your age, medical history, and lifestyle or other factors, such as travel or where you work. What tests do I need? Screening Your health care provider may recommend screening tests for certain conditions. This may include: Lipid and cholesterol levels. Diabetes screening. This is done by checking your blood sugar (glucose) after you have not eaten for a while (fasting). Hepatitis B test. Hepatitis C test. HIV (human immunodeficiency virus) test. STI (sexually transmitted infection)  testing, if you are at risk. Talk with your health care provider about your test results, treatment options, and if necessary, the need for more tests. Follow these instructions at home: Eating and drinking  Eat a healthy diet that includes fresh fruits and vegetables, whole grains, lean protein, and low-fat dairy products. Drink enough fluid to keep your urine pale yellow. Take vitamin and mineral supplements as recommended by your health care provider. Do not drink alcohol if your health care provider tells you not to drink. If you drink alcohol: Limit how much you have to 0-2 drinks a day. Know how much alcohol is in your drink. In the [place] one drink equals one 12 oz bottle of beer (355 mL), one 5 oz glass of wine (148 mL), or one 1 oz glass of hard liquor (44 mL). Lifestyle Brush your teeth every morning and night with fluoride toothpaste. Floss one time each day. Exercise for at least 30 minutes 5 or more days each week. Do not use any products that contain nicotine or tobacco. These products include cigarettes, chewing tobacco, and vaping devices, such as e-cigarettes. If you need help quitting, ask your health care provider. Do not use drugs. If you are sexually active, practice safe sex. Use a condom or other form of protection to prevent STIs. Find healthy ways to manage stress, such as: Meditation, yoga, or listening to music. Journaling. Talking to a trusted person. Spending time with friends and family. Minimize exposure to UV radiation to reduce your risk of skin cancer. Safety Always wear your seat belt while driving or riding in a vehicle. Do not drive: If you have been drinking alcohol. Do not ride with someone who has been drinking. If you have been using any mind-altering substances  or drugs. While texting. When you are tired or distracted. Wear a helmet and other protective equipment during sports activities. If you have firearms in your house, make sure you  follow all gun safety procedures. Seek help if you have been physically or sexually abused. What's next? Go to your health care provider once a year for an annual wellness visit. Ask your health care provider how often you should have your eyes and teeth checked. Stay up to date on all vaccines. This information is not intended to replace advice given to you by your health care provider. Make sure you discuss any questions you have with your health care provider. Document Revised: 06/05/2021 Document Reviewed: 06/05/2021 Elsevier Patient Education  2024 [employer].

## 2024-03-22 NOTE — Progress Notes (Signed)
 Casey Bowers - 38 y.o. male MRN [id number]  Date of birth: 05/15/1986  Subjective Chief Complaint  Patient presents with   Annual Exam    HPI Casey Bowers is a 38 y.o. male here today for annual exam.   He reports that he is doing pretty well.  Seeing GI for GERD.  Currently taking protonix and pepcid for this.    He is moderately active.  He is working on dietary changes.   He is a non-smoker.  Denies EtOH.   Review of Systems  Constitutional:  Negative for chills, fever, malaise/fatigue and weight loss.  HENT:  Negative for congestion, ear pain and sore throat.   Eyes:  Negative for blurred vision, double vision and pain.  Respiratory:  Negative for cough and shortness of breath.   Cardiovascular:  Negative for chest pain and palpitations.  Gastrointestinal:  Negative for abdominal pain, blood in stool, constipation, heartburn and nausea.  Genitourinary:  Negative for dysuria and urgency.  Musculoskeletal:  Negative for joint pain and myalgias.  Neurological:  Negative for dizziness and headaches.  Endo/Heme/Allergies:  Does not bruise/bleed easily.  Psychiatric/Behavioral:  Negative for depression. The patient is not nervous/anxious and does not have insomnia.     No Known Allergies  Past Medical History:  Diagnosis Date   Hyperlipidemia     History reviewed. No pertinent surgical history.  Social History   Socioeconomic History   Marital status: Single    Spouse name: Not on file   Number of children: Not on file   Years of education: Not on file   Highest education level: Not on file  Occupational History   Not on file  Tobacco Use   Smoking status: Never   Smokeless tobacco: Never  Vaping Use   Vaping status: Never Used  Substance and Sexual Activity   Alcohol use: Yes    Alcohol/week: 0.0 standard drinks of alcohol   Drug use: No   Sexual activity: Yes  Other Topics Concern   Not on file  Social History Narrative   Not on file    Social Drivers of Health   Financial Resource Strain: Not on file  Food Insecurity: Not on file  Transportation Needs: Not on file  Physical Activity: Not on file  Stress: Not on file  Social Connections: Unknown (05/02/2022)   Received from [facility]   Social Network    Social Network: Not on file    History reviewed. No pertinent family history.  Health Maintenance  Topic Date Due   Hepatitis C Screening  04/19/2024 (Originally 09/25/2004)   COVID-19 Vaccine (3 - 2024-25 season) 09/07/2024 (Originally 08/23/2023)   INFLUENZA VACCINE  07/22/2024   DTaP/Tdap/Td (3 - Td or Tdap) 02/11/2026   HIV Screening  Completed   HPV VACCINES  Aged Out     ----------------------------------------------------------------------------------------------------------------------------------------------------------------------------------------------------------------- Physical Exam BP 113/73 (BP Location: Left Arm, Patient Position: Sitting, Cuff Size: Normal)   Pulse 65   Ht 5\' 7"  (1.702 m)   Wt 168 lb (76.2 kg)   SpO2 99%   BMI 26.31 kg/m   Physical Exam Constitutional:      General: He is not in acute distress. HENT:     Head: Normocephalic and atraumatic.     Right Ear: Tympanic membrane and external ear normal.     Left Ear: Tympanic membrane and external ear normal.  Eyes:     General: No scleral icterus. Neck:     Thyroid: No thyromegaly.  Cardiovascular:  Rate and Rhythm: Normal rate and regular rhythm.     Heart sounds: Normal heart sounds.  Pulmonary:     Effort: Pulmonary effort is normal.     Breath sounds: Normal breath sounds.  Abdominal:     General: Bowel sounds are normal. There is no distension.     Palpations: Abdomen is soft.     Tenderness: There is no abdominal tenderness. There is no guarding.  Musculoskeletal:     Cervical back: Normal range of motion.  Lymphadenopathy:     Cervical: No cervical adenopathy.  Skin:    General:  Skin is warm and dry.     Findings: No rash.  Neurological:     Mental Status: He is alert and oriented to person, place, and time.     Cranial Nerves: No cranial nerve deficit.     Motor: No abnormal muscle tone.  Psychiatric:        Mood and Affect: Mood normal.        Behavior: Behavior normal.     ------------------------------------------------------------------------------------------------------------------------------------------------------------------------------------------------------------------- Assessment and Plan  Well adult exam Well adult Orders Placed This Encounter  Procedures   CMP14+EGFR   CBC with Differential/Platelet   Lipid Panel With LDL/HDL Ratio   HgB A1c  Screenings: UTD Immunization: UTD Anticipatory guidance/Risk factor reduction:  Recommendations per AVS.    No orders of the defined types were placed in this encounter.   No follow-ups on file.    This visit occurred during the SARS-CoV-2 public health emergency.  Safety protocols were in place, including screening questions prior to the visit, additional usage of staff PPE, and extensive cleaning of exam room while observing appropriate contact time as indicated for disinfecting solutions.

## 2024-03-23 LAB — HEMOGLOBIN A1C
Est. average glucose Bld gHb Est-mCnc: 137 mg/dL
Hgb A1c MFr Bld: 6.4 % — ABNORMAL HIGH (ref 4.8–5.6)

## 2024-03-23 LAB — LIPID PANEL WITH LDL/HDL RATIO
Cholesterol, Total: 170 mg/dL (ref 100–199)
HDL: 31 mg/dL — ABNORMAL LOW (ref >39)
LDL Chol Calc (NIH): 95 mg/dL (ref 0–99)
LDL/HDL Ratio: 3.1 ratio (ref 0.0–3.6)
Triglycerides: 258 mg/dL — ABNORMAL HIGH (ref 0–149)
VLDL Cholesterol Cal: 44 mg/dL — ABNORMAL HIGH (ref 5–40)

## 2024-03-23 LAB — CBC WITH DIFFERENTIAL/PLATELET
Basophils Absolute: 0.1 10*3/uL (ref 0.0–0.2)
Basos: 1 %
EOS (ABSOLUTE): 0.3 10*3/uL (ref 0.0–0.4)
Eos: 4 %
Hematocrit: 45.4 % (ref 37.5–51.0)
Hemoglobin: 15.5 g/dL (ref 13.0–17.7)
Immature Grans (Abs): 0 10*3/uL (ref 0.0–0.1)
Immature Granulocytes: 0 %
Lymphocytes Absolute: 1.8 10*3/uL (ref 0.7–3.1)
Lymphs: 23 %
MCH: 31.4 pg (ref 26.6–33.0)
MCHC: 34.1 g/dL (ref 31.5–35.7)
MCV: 92 fL (ref 79–97)
Monocytes Absolute: 0.7 10*3/uL (ref 0.1–0.9)
Monocytes: 9 %
Neutrophils Absolute: 5.1 10*3/uL (ref 1.4–7.0)
Neutrophils: 63 %
Platelets: 250 10*3/uL (ref 150–450)
RBC: 4.93 x10E6/uL (ref 4.14–5.80)
RDW: 12.2 % (ref 11.6–15.4)
WBC: 8 10*3/uL (ref 3.4–10.8)

## 2024-03-23 LAB — CMP14+EGFR
ALT: 31 IU/L (ref 0–44)
AST: 24 IU/L (ref 0–40)
Albumin: 4.3 g/dL (ref 4.1–5.1)
Alkaline Phosphatase: 127 IU/L — ABNORMAL HIGH (ref 44–121)
BUN/Creatinine Ratio: 17 (ref 9–20)
BUN: 13 mg/dL (ref 6–20)
Bilirubin Total: 0.4 mg/dL (ref 0.0–1.2)
CO2: 24 mmol/L (ref 20–29)
Calcium: 9.2 mg/dL (ref 8.7–10.2)
Chloride: 101 mmol/L (ref 96–106)
Creatinine, Ser: 0.76 mg/dL (ref 0.76–1.27)
Globulin, Total: 2.2 g/dL (ref 1.5–4.5)
Glucose: 119 mg/dL — ABNORMAL HIGH (ref 70–99)
Potassium: 4.1 mmol/L (ref 3.5–5.2)
Sodium: 139 mmol/L (ref 134–144)
Total Protein: 6.5 g/dL (ref 6.0–8.5)
eGFR: 119 mL/min/{1.73_m2} (ref >59)

## 2024-04-01 ENCOUNTER — Encounter: Payer: Self-pay | Admitting: Family Medicine

## 2024-04-12 ENCOUNTER — Encounter: Payer: Self-pay | Admitting: Internal Medicine

## 2024-04-12 ENCOUNTER — Ambulatory Visit: Payer: BC Managed Care – PPO | Admitting: Internal Medicine

## 2024-04-12 VITALS — BP 120/80 | HR 56 | Ht 67.0 in | Wt 165.0 lb

## 2024-04-12 DIAGNOSIS — R1110 Vomiting, unspecified: Secondary | ICD-10-CM | POA: Diagnosis not present

## 2024-04-12 DIAGNOSIS — K2100 Gastro-esophageal reflux disease with esophagitis, without bleeding: Secondary | ICD-10-CM

## 2024-04-12 DIAGNOSIS — K219 Gastro-esophageal reflux disease without esophagitis: Secondary | ICD-10-CM

## 2024-04-12 MED ORDER — FAMOTIDINE 40 MG PO TABS: 40.0000 mg | ORAL_TABLET | Freq: Every day | ORAL | 1 refills | Status: AC

## 2024-04-12 MED ORDER — PANTOPRAZOLE SODIUM 40 MG PO TBEC: 40.0000 mg | DELAYED_RELEASE_TABLET | Freq: Two times a day (BID) | ORAL | 1 refills | Status: DC

## 2024-04-12 MED ORDER — SUCRALFATE 1 G PO TABS: 1.0000 g | ORAL_TABLET | Freq: Two times a day (BID) | ORAL | 0 refills | Status: AC

## 2024-04-12 NOTE — Progress Notes (Signed)
 HPI: 38 year old male with history of GERD and prior diverticulitis presents for follow up of GERD  Interval History: He is taking pantoprazole  40 mg BID. He is taking it 30 min to 1 hr before he eats. He is out of the sucralfate  currently. He rarely has acid reflux, just when he eats really spicy foods or eats late at night time. The pantoprazole  appears to be helping with his acid reflux symptoms. Sucralfate  seemed to be helping too. When he ran out of the sucralfate , he has had a couple of times of breakthrough acid reflux. When he was on sucralfate , he was having one acid reflux event per week. He is taking Pepcid  every day in addition to his PPI BID. Denies N&V or coughing. Stools are normal.   Past Medical History:  Diagnosis Date   Hyperlipidemia     History reviewed. No pertinent surgical history.  Current Outpatient Medications  Medication Sig Dispense Refill   famotidine  (PEPCID ) 40 MG tablet TAKE 1 TABLET BY MOUTH EVERYDAY AT BEDTIME 90 tablet 1   pantoprazole  (PROTONIX ) 40 MG tablet TAKE 1 TABLET BY MOUTH EVERY DAY 90 tablet 1   sucralfate  (CARAFATE ) 1 g tablet Take 1 tablet (1 g total) by mouth 4 (four) times daily. X 4 weeks 120 tablet 0   No current facility-administered medications for this visit.    Allergies as of 04/12/2024   (No Known Allergies)    No family history on file.  Social History   Socioeconomic History   Marital status: Single    Spouse name: Not on file   Number of children: Not on file   Years of education: Not on file   Highest education level: Not on file  Occupational History   Not on file  Tobacco Use   Smoking status: Never   Smokeless tobacco: Never  Vaping Use   Vaping status: Never Used  Substance and Sexual Activity   Alcohol use: Yes    Alcohol/week: 0.0 standard drinks of alcohol   Drug use: No   Sexual activity: Yes  Other Topics Concern   Not on file  Social History Narrative   Not on file   Social Drivers of  Health   Financial Resource Strain: Not on file  Food Insecurity: Not on file  Transportation Needs: Not on file  Physical Activity: Not on file  Stress: Not on file  Social Connections: Unknown (05/02/2022)   Received from [facility], [facility]   Social Network    Social Network: Not on file  Intimate Partner Violence: Unknown (03/26/2022)   Received from [facility], [facility]   HITS    Physically Hurt: Not on file    Insult or Talk Down To: Not on file    Threaten Physical Harm: Not on file    Scream or Curse: Not on file      Physical Exam:  Vital signs: BP 120/80   Pulse (!) 56   Ht 5\' 7"  (1.702 m)   Wt 165 lb (74.8 kg)   BMI 25.84 kg/m   Constitutional: NAD, Well developed, Well nourished, alert and cooperative Head:  Normocephalic and atraumatic. Respiratory: Respirations even and unlabored. Lungs clear to auscultation bilaterally.   No wheezes, crackles, or rhonchi.  Cardiovascular:  Regular rate and rhythm. No peripheral edema, cyanosis or pallor.  Gastrointestinal:  Soft, nondistended, nontender. No rebound or guarding. Normal bowel sounds. No appreciable masses or hepatomegaly. Neurologic:  Alert and  oriented x4;  grossly  normal neurologically.  Skin:   Dry and intact without significant lesions or rashes. Psychiatric: Oriented to person, place and time. Demonstrates good judgement and reason without abnormal affect or behaviors.   RELEVANT LABS AND IMAGING: CBC    Component Value Date/Time   WBC 8.0 03/22/2024 0850   WBC 12.5 (H) 01/12/2024 1019   RBC 4.93 03/22/2024 0850   RBC 4.99 01/12/2024 1019   HGB 15.5 03/22/2024 0850   HCT 45.4 03/22/2024 0850   PLT 250 03/22/2024 0850   MCV 92 03/22/2024 0850   MCH 31.4 03/22/2024 0850   MCH 31.3 04/08/2022 0000   MCHC 34.1 03/22/2024 0850   MCHC 33.5 01/12/2024 1019   RDW 12.2 03/22/2024 0850   LYMPHSABS 1.8 03/22/2024 0850   MONOABS 0.8 01/12/2024 1019   EOSABS 0.3 03/22/2024 0850    BASOSABS 0.1 03/22/2024 0850    CMP     Component Value Date/Time   NA 139 03/22/2024 0850   K 4.1 03/22/2024 0850   CL 101 03/22/2024 0850   CO2 24 03/22/2024 0850   GLUCOSE 119 (H) 03/22/2024 0850   GLUCOSE 120 (H) 01/12/2024 1019   BUN 13 03/22/2024 0850   CREATININE 0.76 03/22/2024 0850   CREATININE 0.80 04/08/2022 0000   CALCIUM 9.2 03/22/2024 0850   PROT 6.5 03/22/2024 0850   ALBUMIN 4.3 03/22/2024 0850   AST 24 03/22/2024 0850   ALT 31 03/22/2024 0850   ALKPHOS 127 (H) 03/22/2024 0850   BILITOT 0.4 03/22/2024 0850   GFRNONAA 122 04/02/2021 0000   GFRAA 142 04/02/2021 0000   EGD 01/20/24: - LA Grade A esophagitis with no bleeding. Biopsied. - Erythematous mucosa in the antrum. Biopsied. - Duodenitis. Path: 1. Surgical [P], gastric bx :       UNREMARKABLE GASTRIC ANTRAL AND OXYNTIC MUCOSA.       NEGATIVE FOR HELICOBACTER PYLORI.       2. Surgical [P], mid esophagus and distal esophagus :       SQUAMOUS MUCOSA WITH MILD REFLUX CHANGES.       NEGATIVE FOR EOSINOPHILIC ESOPHAGITIS.       PAS STAIN FOR FUNGUS IS NEGATIVE.       SMALL FRAGMENT OF BENIGN GASTRIC MUCOSA.    Assessment/Plan:   Gastroesophageal Reflux Disease (GERD)  History of grade A esophagitis Regurgitation Nausea/Vomiting - Resolved Cough - Resolved Patient's GERD is now under better control on pantoprazole  BID and famotidine  every day. He notices that when he eats late or eats spicy foods, this seems to worsen his GERD. His previously noted N&V and cough has resolved complete, which is reassuring. Patient states that he did have benefit from the sucralfate  medication so will have him restart it at the BID dosing. - Continue pantoprazole  40 mg BID. Refill.  - Cont Famotidine  40 mg at bedtime. Refill - Will give another month of sucralfate  susp 1 g BID - RTC 6 months  Regino Caprio, MD [facility] 04/12/2024, 8:48 AM  I spent 30 minutes of time, including in depth chart review,  independent review of results as outlined above, communicating results with the patient directly, face-to-face time with the patient, coordinating care, and ordering studies and medications as appropriate, and documentation.

## 2024-04-12 NOTE — Patient Instructions (Addendum)
 Follow up in 6 months  We have sent the following medications to your pharmacy for you to pick up at your convenience: Pantoprazole , Famotidine ,  Sucralfate    If your blood pressure at your visit was 140/90 or greater, please contact your primary care physician to follow up on this.   If you are age 39 or older, your body mass index should be between 23-30. Your Body mass index is 25.84 kg/m. If this is out of the aforementioned range listed, please consider follow up with your Primary Care Provider.  If you are age 75 or younger, your body mass index should be between 19-25. Your Body mass index is 25.84 kg/m. If this is out of the aformentioned range listed, please consider follow up with your Primary Care Provider.   ________________________________________________________  The [facility] providers would like to encourage you to use MYCHART to communicate with providers for non-urgent requests or questions.  Due to long hold times on the telephone, sending your provider a message by [facility] may be a faster and more efficient way to get a response.  Please allow 48 business hours for a response.  Please remember that this is for non-urgent requests.  _______________________________________________________  Thank you for entrusting me with your care and for choosing [facility],  Dr. Regino Caprio

## 2024-09-07 ENCOUNTER — Other Ambulatory Visit: Payer: Self-pay | Admitting: Internal Medicine
